# Patient Record
Sex: Male | Born: 1940 | Race: White | Hispanic: No | Marital: Single | Smoking: Former smoker
Health system: Southern US, Community
[De-identification: ages and names within clinical notes are randomized; demographics above are authoritative.]

## PROBLEM LIST (undated history)

## (undated) DIAGNOSIS — L309 Dermatitis, unspecified: Secondary | ICD-10-CM

## (undated) DIAGNOSIS — A809 Acute poliomyelitis, unspecified: Secondary | ICD-10-CM

## (undated) DIAGNOSIS — J449 Chronic obstructive pulmonary disease, unspecified: Secondary | ICD-10-CM

## (undated) HISTORY — DX: Chronic obstructive pulmonary disease, unspecified: J44.9

## (undated) HISTORY — DX: Dermatitis, unspecified: L30.9

## (undated) HISTORY — DX: Acute poliomyelitis, unspecified: A80.9

## (undated) HISTORY — PX: HERNIA REPAIR: SHX51

---

## 2003-03-10 ENCOUNTER — Encounter (HOSPITAL_BASED_OUTPATIENT_CLINIC_OR_DEPARTMENT_OTHER): Payer: Self-pay | Admitting: General Surgery

## 2003-03-16 ENCOUNTER — Ambulatory Visit (HOSPITAL_COMMUNITY): Admission: RE | Admit: 2003-03-16 | Discharge: 2003-03-16 | Payer: Self-pay | Admitting: General Surgery

## 2015-11-08 DIAGNOSIS — G252 Other specified forms of tremor: Secondary | ICD-10-CM | POA: Insufficient documentation

## 2015-11-08 DIAGNOSIS — J309 Allergic rhinitis, unspecified: Secondary | ICD-10-CM | POA: Insufficient documentation

## 2015-11-08 DIAGNOSIS — L309 Dermatitis, unspecified: Secondary | ICD-10-CM | POA: Insufficient documentation

## 2015-11-08 DIAGNOSIS — J449 Chronic obstructive pulmonary disease, unspecified: Secondary | ICD-10-CM | POA: Insufficient documentation

## 2015-11-08 DIAGNOSIS — R259 Unspecified abnormal involuntary movements: Secondary | ICD-10-CM

## 2015-11-09 DIAGNOSIS — N401 Enlarged prostate with lower urinary tract symptoms: Secondary | ICD-10-CM | POA: Insufficient documentation

## 2015-11-09 DIAGNOSIS — R600 Localized edema: Secondary | ICD-10-CM | POA: Insufficient documentation

## 2016-05-02 DIAGNOSIS — M1990 Unspecified osteoarthritis, unspecified site: Secondary | ICD-10-CM | POA: Insufficient documentation

## 2016-05-08 ENCOUNTER — Telehealth: Payer: Self-pay | Admitting: Hematology

## 2016-05-08 NOTE — Telephone Encounter (Signed)
Tc to pt who says that I should contact his sister-in-law or brother for his appts. Cld the pt's sister-in-law to schedule appt. Appt has been scheduled w/Kale on 12/13 at 11 am. Aware to have the pt arrive 30 minutes early. Unable to verify all of the pt's demographic info.

## 2016-05-16 ENCOUNTER — Ambulatory Visit (HOSPITAL_BASED_OUTPATIENT_CLINIC_OR_DEPARTMENT_OTHER): Payer: Medicare Other

## 2016-05-16 ENCOUNTER — Telehealth: Payer: Self-pay | Admitting: Hematology

## 2016-05-16 ENCOUNTER — Ambulatory Visit (HOSPITAL_BASED_OUTPATIENT_CLINIC_OR_DEPARTMENT_OTHER): Payer: Medicare Other | Admitting: Hematology

## 2016-05-16 ENCOUNTER — Encounter: Payer: Self-pay | Admitting: Hematology

## 2016-05-16 VITALS — BP 150/85 | HR 85 | Temp 97.5°F | Resp 18 | Ht 68.0 in | Wt 116.1 lb

## 2016-05-16 DIAGNOSIS — D721 Eosinophilia, unspecified: Secondary | ICD-10-CM

## 2016-05-16 DIAGNOSIS — R634 Abnormal weight loss: Secondary | ICD-10-CM

## 2016-05-16 LAB — CBC & DIFF AND RETIC
BASO%: 0.5 % (ref 0.0–2.0)
Basophils Absolute: 0 10*3/uL (ref 0.0–0.1)
EOS ABS: 0.6 10*3/uL — AB (ref 0.0–0.5)
EOS%: 7.9 % — AB (ref 0.0–7.0)
HCT: 41.9 % (ref 38.4–49.9)
HEMOGLOBIN: 14.7 g/dL (ref 13.0–17.1)
Immature Retic Fract: 0.7 % — ABNORMAL LOW (ref 3.00–10.60)
LYMPH#: 2.1 10*3/uL (ref 0.9–3.3)
LYMPH%: 26.2 % (ref 14.0–49.0)
MCH: 31.3 pg (ref 27.2–33.4)
MCHC: 35.1 g/dL (ref 32.0–36.0)
MCV: 89.3 fL (ref 79.3–98.0)
MONO#: 0.5 10*3/uL (ref 0.1–0.9)
MONO%: 5.7 % (ref 0.0–14.0)
NEUT#: 4.7 10*3/uL (ref 1.5–6.5)
NEUT%: 59.7 % (ref 39.0–75.0)
Platelets: 254 10*3/uL (ref 140–400)
RBC: 4.69 10*6/uL (ref 4.20–5.82)
RDW: 13.3 % (ref 11.0–14.6)
RETIC %: 0.76 % — AB (ref 0.80–1.80)
RETIC CT ABS: 35.64 10*3/uL (ref 34.80–93.90)
WBC: 7.9 10*3/uL (ref 4.0–10.3)

## 2016-05-16 LAB — TSH: TSH: 3.131 m(IU)/L (ref 0.320–4.118)

## 2016-05-16 LAB — COMPREHENSIVE METABOLIC PANEL
ALT: 17 U/L (ref 0–55)
AST: 19 U/L (ref 5–34)
Albumin: 3.9 g/dL (ref 3.5–5.0)
Alkaline Phosphatase: 82 U/L (ref 40–150)
Anion Gap: 9 mEq/L (ref 3–11)
BUN: 9.7 mg/dL (ref 7.0–26.0)
CHLORIDE: 107 meq/L (ref 98–109)
CO2: 27 mEq/L (ref 22–29)
CREATININE: 0.8 mg/dL (ref 0.7–1.3)
Calcium: 9.8 mg/dL (ref 8.4–10.4)
EGFR: 90 mL/min/{1.73_m2} — ABNORMAL LOW (ref >90)
GLUCOSE: 102 mg/dL (ref 70–140)
POTASSIUM: 4.8 meq/L (ref 3.5–5.1)
SODIUM: 143 meq/L (ref 136–145)
Total Bilirubin: 0.66 mg/dL (ref 0.20–1.20)
Total Protein: 7.5 g/dL (ref 6.4–8.3)

## 2016-05-16 LAB — LACTATE DEHYDROGENASE: LDH: 214 U/L (ref 125–245)

## 2016-05-16 LAB — CHCC SMEAR

## 2016-05-16 NOTE — Telephone Encounter (Signed)
Appointments scheduled per 12/13 LOS. Patient given AVS report and calendars with future scheduled appointments.  °

## 2016-05-16 NOTE — Progress Notes (Signed)
[facility]    HEMATOLOGY/ONCOLOGY CONSULTATION NOTE  Date of Service: 05/16/2016  Patient Care Team: Dewayne Shorter, PA-C as PCP - General (Physician Assistant)  CHIEF COMPLAINTS/PURPOSE OF CONSULTATION:  Eosinophilia  HISTORY OF PRESENTING ILLNESS:   Austin Perez is a wonderful 75 y.o. male who has been referred to [place] by Dr .Antony Salmon, Stephani Police, PA-C  for evaluation and management of eosinophilia.  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 Normal platelets of 251k.   patient notes no fevers no chills no night sweats. Does not report any enlarged lymph nodes. No unexpected weight loss. No new fatigue. No new pulmonary symptoms. No abdominal pain or distention.  Reports no new medications. Is not on chronic NSAIDs. No specific use of recent antibiotics.  Does have eczema that gets worse in the fall . Also has intermittent allergic rhinitis . No recent travel . No diarrhea . No focal symptoms suggestive of a malignancy.   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 #7 resting tremor  SURGICAL HISTORY:  Hernia repair  SOCIAL HISTORY: Social History   Social History  . Marital status: Single    Spouse name: N/A  . Number of children: N/A  . Years of education: N/A   Occupational History  . Not on file.   Social History Main Topics  . Smoking status: Not on file  . Smokeless tobacco: Not on file  . Alcohol use Not on file  . Drug use: Unknown  . Sexual activity: Not on file   Other Topics Concern  . Not on  file   Social History Narrative  . No narrative on file  Patient smoked one pack per day from age 61 to about age 10 years. 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 No family history of blood disorders  ALLERGIES:  has No Known Allergies.  MEDICATIONS:  Current Outpatient Prescriptions  Medication Sig Dispense Refill  . albuterol (PROVENTIL HFA;VENTOLIN HFA) 108 (90 Base) MCG/ACT inhaler INHALE 1 TO 2 PUFFS EVERY 4 TO 6 HOURS AS NEEDED.    [facility] Kitchen betamethasone dipropionate (DIPROLENE) 0.05 % cream APPLY SPARINGLY TO AFFECTED AREA(S) TWICE DAILY    . budesonide-formoterol (SYMBICORT) 160-4.5 MCG/ACT inhaler INHALE 1 PUFF TWICE DAILY    . doxazosin (CARDURA) 4 MG tablet TAKE 1-2 TABLETS BY MOUTH EVERY NIGHT ABOUT 30 MINUTES BEFORE BEDTIME     No current facility-administered medications for this visit.     REVIEW OF SYSTEMS:    10 Point review of Systems was done is negative except as noted above.  PHYSICAL EXAMINATION: ECOG PERFORMANCE STATUS: 2  . Vitals:   05/16/16 1113  BP: (!) 150/85  Pulse: 85  Resp: 18  Temp: 97.5 F (36.4 C)   Filed Weights   05/16/16 1113  Weight: 116 lb 1.6 oz (52.7 kg)   .Body mass index is 17.65 kg/m.  GENERAL:alert, in no acute distress and comfortable SKIN: No acute  rashes EYES: normal, conjunctiva are pink and non-injected, sclera clear OROPHARYNX:no exudate, no erythema and lips, buccal mucosa, and tongue normal  NECK: supple, no JVD, thyroid normal size, non-tender, without nodularity LYMPH:  no palpable lymphadenopathy in the cervical, axillary or inguinal LUNGS: Decreased air entry bilaterally no rales no rhonchi  HEART: regular rate & rhythm,  no murmurs ABDOMEN: abdomen soft, non-tender, normoactive bowel sounds , palpable hepatosplenomegaly Musculoskeletal: Bilateral trace pedal edema PSYCH: alert & oriented x 3 with fluent  speech NEURO: Some right-sided weakness  LABORATORY DATA:  I have reviewed the data as listed  . CBC Latest Ref Rng & Units 05/16/2016  WBC 4.0 - 10.3 10e3/uL 7.9  Hemoglobin 13.0 - 17.1 g/dL 14.7  Hematocrit 38.4 - 49.9 % 41.9  Platelets 140 - 400 10e3/uL 254   . CBC    Component Value Date/Time   WBC 7.9 05/16/2016 1223   RBC 4.69 05/16/2016 1223   HGB 14.7 05/16/2016 1223   HCT 41.9 05/16/2016 1223   PLT 254 05/16/2016 1223   MCV 89.3 05/16/2016 1223   MCH 31.3 05/16/2016 1223   MCHC 35.1 05/16/2016 1223   RDW 13.3 05/16/2016 1223   LYMPHSABS 2.1 05/16/2016 1223   MONOABS 0.5 05/16/2016 1223   EOSABS 0.6 (H) 05/16/2016 1223   BASOSABS 0.0 05/16/2016 1223   . CMP Latest Ref Rng & Units 05/16/2016  Glucose 70 - 140 mg/dl 102  BUN 7.0 - 26.0 mg/dL 9.7  Creatinine 0.7 - 1.3 mg/dL 0.8  Sodium 136 - 145 mEq/L 143  Potassium 3.5 - 5.1 mEq/L 4.8  CO2 22 - 29 mEq/L 27  Calcium 8.4 - 10.4 mg/dL 9.8  Total Protein 6.4 - 8.3 g/dL 7.5  Total Bilirubin 0.20 - 1.20 mg/dL 0.66  Alkaline Phos 40 - 150 U/L 82  AST 5 - 34 U/L 19  ALT 0 - 55 U/L 17   Component     Latest Ref Rng & Units 05/16/2016  Tryptase     2.2 - 13.2 ug/L 3.9  Sed Rate     0 - 30 mm/hr 2  TSH     0.320 - 4.118 m(IU)/L 3.131   . Lab Results  Component Value Date   LDH 214 05/16/2016      RADIOGRAPHIC STUDIES: I have personally reviewed the radiological images as listed and agreed with the findings in the report. No results found.  ASSESSMENT & PLAN:   75 year old gentleman with   1) Isolated Eosinophilia  His absolute eosinophil count has decreased from 2.7k to 0.6k from 05/03/2016 to 05/16/2016. This suggests a likely reactive in etiology. Patient had no fever no chills no night sweats no weight loss no lymphadenopathy or hepatosplenomegaly to suggest a lymphoproliferative or myeloproliferative process. No lymphocytosis. No polycythemia or thrombocytosis. LDH level is within normal  limits.  Workup was done to rule out a clonal eosinophilia that might suggest a hypereosinophilic syndrome. The PDGFRA, PDFGRB and FGFR1 mutations are negative which also reduces the chance that this is a clonal eosinophilia.  Plan -Workup with eosinophilia was done and does not suggest a clonal eosinophilia such as chronic eosinophilic leukemia or eosinophilia associated with a myeloproliferative or lymphoproliferative process. -Rapid improvement in eosinophilia suggested that it was likely a reactive process to an environmental allergy [known to have allergic rhinitis], or related to his eczema or some other medication. -No recent travel or gastrointestinal symptoms to suggest a parasitic infection. -No focal symptoms to suggest a paraneoplastic eosinophilia related to malignancy. Would  need to follow-up with his primary care physician for age-appropriate cancer screening. -Bone marrow biopsy is not recommended at this time.  Would recommend continued follow-up with his primary care physician with repeat CBC with differential in 6 months. Kindly consult [place] of his eosinophil counts continue to trend upwards to more than 3k.  I appreciate this interesting consultation.    All of the patients questions were answered with apparent satisfaction. The patient knows to call the clinic with any problems, questions or concerns.  I spent 45 minutes counseling the patient face to face. The total time spent in the appointment was 60 minutes and more than 50% was on counseling and direct patient cares.    Sullivan Lone MD [place] AAHIVMS [facility] Hematology/Oncology Physician [facility]  (Office):       [phone] (Work cell):  [phone] (Fax):           [phone]  05/16/2016 11:29 AM

## 2016-05-17 LAB — TRYPTASE: TRYPTASE: 3.9 ug/L (ref 2.2–13.2)

## 2016-05-17 LAB — SEDIMENTATION RATE: Sedimentation Rate-Westergren: 2 mm/hr (ref 0–30)

## 2016-05-23 LAB — MPN W/HYPEREOSINOPHILIA FISH
MPN CELLS ANALYZED: 100
MPN CELLS COUNTED: 100

## 2016-06-07 ENCOUNTER — Telehealth: Payer: Self-pay | Admitting: *Deleted

## 2016-06-07 NOTE — Telephone Encounter (Signed)
"  This is Austin AhmadiCarol Perez calling for results of last months blood work.  The nurse was to call results."    Will notify provider of this request.  Also no F/U appointments scheduled at this time.  Sister-n-law return number is [phone].

## 2016-07-04 ENCOUNTER — Telehealth: Payer: Self-pay | Admitting: *Deleted

## 2016-07-04 NOTE — Telephone Encounter (Signed)
"  This is Austin Perez calling for my husband Austin Perez.  We have been waiting for his lab results since December."  Please call [phone]."

## 2016-07-30 ENCOUNTER — Telehealth: Payer: Self-pay | Admitting: *Deleted

## 2016-07-30 DIAGNOSIS — D721 Eosinophilia, unspecified: Secondary | ICD-10-CM | POA: Insufficient documentation

## 2016-07-30 NOTE — Telephone Encounter (Signed)
-----   Message from Almon RegisterLoren M Oflaherty, RN sent at 07/30/2016  9:46 AM EST ----- Regarding: FW: results   ----- Message ----- From: Johney MaineGautam Kishore Kale, MD Sent: 07/29/2016  10:33 PM To: Almon RegisterLoren M Oflaherty, RN Subject: results                                        Hi Loren, Could you please call and let Mr Austin Perez know that his Eosinophil counts had near normalized and appear to be reactive likely from allergies. Hypereosinophilic syndrome FISH panel was negative and this strongly suggests against a clonal eosinophilic disorders such as chronic eosinophilic leukemia. Details per my note.  Thanks, GK

## 2016-07-30 NOTE — Telephone Encounter (Signed)
Per staff message, RN informed patient of information below and patient verbalized understanding.

## 2017-01-02 ENCOUNTER — Other Ambulatory Visit: Payer: Self-pay

## 2017-01-02 DIAGNOSIS — D721 Eosinophilia, unspecified: Secondary | ICD-10-CM

## 2017-01-03 ENCOUNTER — Other Ambulatory Visit: Payer: Medicare Other

## 2017-01-03 ENCOUNTER — Ambulatory Visit: Payer: Medicare Other | Admitting: Hematology

## 2017-09-03 DIAGNOSIS — A809 Acute poliomyelitis, unspecified: Secondary | ICD-10-CM | POA: Insufficient documentation

## 2020-08-18 ENCOUNTER — Ambulatory Visit: Payer: Medicare (Managed Care) | Admitting: Podiatry

## 2020-08-30 ENCOUNTER — Ambulatory Visit (INDEPENDENT_AMBULATORY_CARE_PROVIDER_SITE_OTHER): Payer: Medicare (Managed Care) | Admitting: Podiatry

## 2020-08-30 ENCOUNTER — Encounter: Payer: Self-pay | Admitting: Podiatry

## 2020-08-30 ENCOUNTER — Other Ambulatory Visit: Payer: Self-pay

## 2020-08-30 DIAGNOSIS — B351 Tinea unguium: Secondary | ICD-10-CM | POA: Diagnosis not present

## 2020-08-30 DIAGNOSIS — M79676 Pain in unspecified toe(s): Secondary | ICD-10-CM

## 2020-08-30 NOTE — Progress Notes (Signed)
  Subjective:  Patient ID: Austin Perez, male    DOB: [birth year],  MRN: [id number] HPI Chief Complaint  Patient presents with  . Toe Pain    3rd toe right - tip of toe sore, nail is long and thick  . Toe Injury    4th toe right - toenail fell off or got caught onto something - unsure  . Debridement    Toenails - long, thick and discolored - unable to trim well himself  . New Patient (Initial Visit)    80 y.o. male presents with the above complaint.   ROS: Denies fever chills nausea vomiting muscle aches pains calf pain back pain chest pain shortness of breath.  Past Medical History:  Diagnosis Date  . COPD (chronic obstructive pulmonary disease) (HCC)   . Eczema   . Polio      Current Outpatient Medications:  .  doxazosin (CARDURA) 4 MG tablet, Take 4 mg by mouth daily., Disp: , Rfl:  .  albuterol (PROVENTIL HFA;VENTOLIN HFA) 108 (90 Base) MCG/ACT inhaler, INHALE 1 TO 2 PUFFS EVERY 4 TO 6 HOURS AS NEEDED., Disp: , Rfl:  .  budesonide-formoterol (SYMBICORT) 160-4.5 MCG/ACT inhaler, INHALE 1 PUFF TWICE DAILY, Disp: , Rfl:   No Known Allergies Review of Systems Objective:  There were no vitals filed for this visit.  General: Well developed, nourished, in no acute distress, alert and oriented x3   Dermatological: Skin is warm, dry and supple bilateral. Nails x 10 are well maintained; remaining integument appears unremarkable at this time. There are no open sores, no preulcerative lesions, no rash or signs of infection present.  Distal clavus third toe right foot nail avulsion fourth toe right foot   Vascular: Dorsalis Pedis artery and Posterior Tibial artery pedal pulses are 2/4 bilateral with immedate capillary fill time. Pedal hair growth present. No varicosities and no lower extremity edema present bilateral.   Neruologic: Grossly intact via light touch bilateral. Vibratory intact via tuning fork bilateral. Protective threshold with Semmes Wienstein monofilament intact to  all pedal sites bilateral. Patellar and Achilles deep tendon reflexes 2+ bilateral. No Babinski or clonus noted bilateral.   Musculoskeletal: No gross boney pedal deformities bilateral. No pain, crepitus, or limitation noted with foot and ankle range of motion bilateral. Muscular strength 5/5 in all groups tested bilateral.  Gait: Unassisted, Nonantalgic.    Radiographs:  None taken  Assessment & Plan:   Assessment: Hammertoe deformities with calluses distal clavus third digit right.  Pain in limb secondary to onychomycosis bilateral.  Plan: Debridement of nails and calluses today placed buttress pad underneath the right forefoot.  He will follow-up with Dr. Donzetta Matters for routine care.     Makisha Marrin T. [place], [place]

## 2020-11-02 ENCOUNTER — Other Ambulatory Visit: Payer: Self-pay | Admitting: Family Medicine

## 2020-11-02 ENCOUNTER — Ambulatory Visit
Admission: RE | Admit: 2020-11-02 | Discharge: 2020-11-02 | Disposition: A | Discharge status: Home / Self Care | Payer: Medicare Other | Source: Ambulatory Visit | Attending: Family Medicine | Admitting: Family Medicine

## 2020-11-02 DIAGNOSIS — R634 Abnormal weight loss: Secondary | ICD-10-CM

## 2020-11-02 DIAGNOSIS — J449 Chronic obstructive pulmonary disease, unspecified: Secondary | ICD-10-CM

## 2020-12-12 ENCOUNTER — Other Ambulatory Visit: Payer: Self-pay

## 2020-12-12 ENCOUNTER — Ambulatory Visit (INDEPENDENT_AMBULATORY_CARE_PROVIDER_SITE_OTHER): Payer: Medicare (Managed Care) | Admitting: Podiatry

## 2020-12-12 DIAGNOSIS — M79676 Pain in unspecified toe(s): Secondary | ICD-10-CM | POA: Diagnosis not present

## 2020-12-12 DIAGNOSIS — L84 Corns and callosities: Secondary | ICD-10-CM

## 2020-12-12 DIAGNOSIS — M79674 Pain in right toe(s): Secondary | ICD-10-CM

## 2020-12-12 DIAGNOSIS — B351 Tinea unguium: Secondary | ICD-10-CM | POA: Diagnosis not present

## 2020-12-12 NOTE — Progress Notes (Signed)
Subjective: Austin Perez is a pleasant 80 y.o. male patient seen today painful thick toenails that are difficult to trim. Pain interferes with ambulation. Aggravating factors include wearing enclosed shoe gear. Pain is relieved with periodic professional debridement.  His son is present during today's visit. They voice no new pedal problems on today's visit.  PCP is Gwenlyn Found, MD. Last visit was: 10/26/2020.  No Known Allergies  Objective: Physical Exam  General: Austin Perez is a pleasant 80 y.o. Caucasian male, WD, WN in NAD. AAO x 3.   Vascular:  Capillary refill time to digits immediate b/l. Palpable pedal pulses b/l LE. Pedal hair absent. Lower extremity skin temperature gradient within normal limits. No pain with calf compression b/l. No edema noted b/l lower extremities.  Dermatological:  Pedal skin with normal turgor, texture and tone b/l lower extremities No open wounds b/l lower extremities No interdigital macerations b/l lower extremities Toenails 1-5 b/l elongated, discolored, dystrophic, thickened, crumbly with subungual debris and tenderness to dorsal palpation. Hyperkeratotic lesion(s) R 3rd toe.  No erythema, no edema, no drainage, no fluctuance.  Musculoskeletal:  Normal muscle strength 5/5 to all lower extremity muscle groups bilaterally. No pain crepitus or joint limitation noted with ROM b/l. Hammertoe(s) noted to the 2-5 bilaterally.  Neurological:  Protective sensation intact 5/5 intact bilaterally with 10g monofilament b/l.  Assessment and Plan:  1. Pain due to onychomycosis of toenail   2. Clavus   3. Pain in right toe(s)     -Examined patient. -Patient to continue soft, supportive shoe gear daily. -Toenails 1-5 b/l were debrided in length and girth with sterile nail nippers and dremel without iatrogenic bleeding.  -Corn(s) R 3rd toe pared utilizing sterile scalpel blade without complication or incident. Total number debrided=1. Continue  buttress pad for daily protection. -Patient to report any pedal injuries to medical professional immediately. -Patient/POA to call should there be question/concern in the interim.  Return in about 3 months (around 03/14/2021).  Freddie Breech, DPM

## 2020-12-15 ENCOUNTER — Encounter: Payer: Self-pay | Admitting: Podiatry

## 2021-03-20 ENCOUNTER — Ambulatory Visit (INDEPENDENT_AMBULATORY_CARE_PROVIDER_SITE_OTHER): Payer: Medicare (Managed Care) | Admitting: Podiatry

## 2021-03-20 ENCOUNTER — Other Ambulatory Visit: Payer: Self-pay

## 2021-03-20 DIAGNOSIS — B351 Tinea unguium: Secondary | ICD-10-CM

## 2021-03-20 DIAGNOSIS — M79676 Pain in unspecified toe(s): Secondary | ICD-10-CM

## 2021-03-23 ENCOUNTER — Encounter: Payer: Self-pay | Admitting: Podiatry

## 2021-03-23 NOTE — Progress Notes (Signed)
  Subjective:  Patient ID: Austin Perez, male    DOB: 05-29-1941,  MRN: [id number]  Austin Perez presents to clinic today for thick, elongated toenails b/l feet which are tender when wearing enclosed shoe gear.  He notes no new pedal problems on today's visit.  PCP is Gwenlyn Found, MD , and last visit was 01/27/2021.  No Known Allergies  Review of Systems: Negative except as noted in the HPI. Objective:   Constitutional Austin Perez is a pleasant 80 y.o. Caucasian male, WD, WN in NAD. AAO x 3.   Vascular Capillary refill time to digits immediate b/l. Palpable DP pulse(s) b/l lower extremities Palpable PT pulse(s) b/l lower extremities Pedal hair absent. Lower extremity skin temperature gradient within normal limits. No pain with calf compression b/l. No edema noted b/l lower extremities. No cyanosis or clubbing noted.  Neurologic Normal speech. Oriented to person, place, and time. Protective sensation intact 5/5 intact bilaterally with 10g monofilament b/l. Vibratory sensation intact b/l.  Dermatologic Pedal integument with normal turgor, texture and tone BLE. No open wounds b/l LE. No interdigital macerations b/l lower extremities. Toenails 1-5 left, R hallux, R 2nd toe, R 3rd toe, and R 5th toe elongated, discolored, dystrophic, thickened, and crumbly with subungual debris and tenderness to dorsal palpation. There is noted onchyolysis of entire nailplate of R 4th toe.  The nailbeds remain intact. There is no erythema, no edema, no drainage, no underlying fluctuance.  Orthopedic: Normal muscle strength 5/5 to all lower extremity muscle groups bilaterally. Hammertoe(s) noted to the 2-5 bilaterally.   Radiographs: None Assessment:   1. Pain due to onychomycosis of toenail    Plan:  Patient was evaluated and treated and all questions answered. Consent given for treatment as described below: -Patient to continue soft, supportive shoe gear daily. -Toenails 1-5 left, R hallux, R  2nd toe, R 3rd toe, and R 5th toe debrided in length and girth without iatrogenic bleeding with sterile nail nipper and dremel.  -Right 4th toe nailplate gently debrided from it's remaining attachment to digit. Nailbed cleansed with alcohol. Triple antibiotic ointment applied. -Patient to report any pedal injuries to medical professional immediately. -Patient/POA to call should there be question/concern in the interim.  Return in about 3 months (around 06/20/2021).  Freddie Breech, DPM

## 2021-05-17 DIAGNOSIS — I700 Atherosclerosis of aorta: Secondary | ICD-10-CM | POA: Insufficient documentation

## 2021-06-28 ENCOUNTER — Ambulatory Visit (INDEPENDENT_AMBULATORY_CARE_PROVIDER_SITE_OTHER): Payer: Medicare (Managed Care) | Admitting: Podiatry

## 2021-06-28 ENCOUNTER — Encounter: Payer: Self-pay | Admitting: Podiatry

## 2021-06-28 ENCOUNTER — Other Ambulatory Visit: Payer: Self-pay

## 2021-06-28 DIAGNOSIS — B351 Tinea unguium: Secondary | ICD-10-CM | POA: Diagnosis not present

## 2021-06-28 DIAGNOSIS — M79676 Pain in unspecified toe(s): Secondary | ICD-10-CM

## 2021-07-04 ENCOUNTER — Encounter: Payer: Self-pay | Admitting: Podiatry

## 2021-07-04 NOTE — Progress Notes (Signed)
°  Subjective:  Patient ID: Austin Perez, male    DOB: 08/15/1940,  MRN: [id number]  81 y.o. male presents painful elongated mycotic toenails 1-5 bilaterally which are tender when wearing enclosed shoe gear. Pain is relieved with periodic professional debridement.  New problem(s): None   PCP is Gwenlyn Found, MD , and last visit was 12//14/2022.  No Known Allergies  Review of Systems: Negative except as noted in the HPI.   Objective:  Vascular Examination: Vascular status intact b/l with palpable pedal pulses. CFT immediate b/l. No edema. No pain with calf compression b/l. Skin temperature gradient WNL b/l. Pedal hair absent.  Neurological Examination: Protective sensation intact 5/5 intact bilaterally with 10g monofilament b/l. Vibratory sensation intact b/l.  Dermatological Examination: Pedal skin warm and supple b/l.  No open wounds b/l. No interdigital macerations. Toenails 1-5 b/l elongated, thickened, discolored with subungual debris. +Tenderness with dorsal palpation of nailplates. No hyperkeratotic nor porokeratotic lesions noted b/l.  Musculoskeletal Examination: Muscle strength 5/5 to all lower extremity muscle groups bilaterally. No pain, crepitus or joint limitation noted with ROM bilateral LE. Hammertoe deformity noted 2-5 b/l.  Radiographs: None  Assessment:   1. Pain due to onychomycosis of toenail    Plan:  -No new findings. No new orders. -Mycotic toenails 1-5 bilaterally were debrided in length and girth with sterile nail nippers and dremel without incident. -Patient/POA to call should there be question/concern in the interim.  Return in about 3 months (around 09/26/2021).  Freddie Breech, DPM

## 2021-09-29 ENCOUNTER — Encounter: Payer: Self-pay | Admitting: Podiatry

## 2021-09-29 ENCOUNTER — Ambulatory Visit (INDEPENDENT_AMBULATORY_CARE_PROVIDER_SITE_OTHER): Payer: Medicare (Managed Care) | Admitting: Podiatry

## 2021-09-29 DIAGNOSIS — M79676 Pain in unspecified toe(s): Secondary | ICD-10-CM

## 2021-09-29 DIAGNOSIS — B351 Tinea unguium: Secondary | ICD-10-CM

## 2021-10-08 NOTE — Progress Notes (Signed)
?  Subjective:  ?Patient ID: Austin Perez, male    DOB: [birth year],  MRN: [id number] ? ?Austin Perez presents to clinic today for painful thick toenails that are difficult to trim. Pain interferes with ambulation. Aggravating factors include wearing enclosed shoe gear. Pain is relieved with periodic professional debridement. ? ?New problem(s): None.  ? ?PCP is Gwenlyn Found, MD , and last visit was May 17, 2021. ? ?No Known Allergies ? ?Review of Systems: Negative except as noted in the HPI. ? ?Objective: No changes noted in today's physical examination. ? ?Vascular Examination: ?Palpable pedal pulses b/l LE. Digital hair present b/l. No pedal edema b/l. Skin temperature gradient WNL b/l. No varicosities b/l. ? ?Dermatological Examination: ?Pedal skin with normal turgor, texture and tone b/l. No open wounds. No interdigital macerations b/l. Toenails 1-5 b/l thickened, discolored, dystrophic with subungual debris. There is pain on palpation to dorsal aspect of nailplates. No hyperkeratotic nor porokeratotic lesions present on today's visit.. ? ?Neurological Examination: ?Protective sensation intact with 10 gram monofilament b/l LE. Vibratory sensation intact b/l LE.  ? ?Musculoskeletal Examination: ?Muscle strength 5/5 to all LE muscle groups b/l. No pain, crepitus or joint limitation noted with ROM bilateral LE. Hammertoe deformity noted 2-5 b/l. ? ?Assessment/Plan: ?1. Pain due to onychomycosis of toenail   ?  ?-Patient was evaluated and treated. All patient's and/or POA's questions/concerns answered on today's visit. ?-Patient to continue soft, supportive shoe gear daily. ?-Toenails 1-5 b/l were debrided in length and girth with sterile nail nippers and dremel without iatrogenic bleeding.  ?-Patient/POA to call should there be question/concern in the interim.  ? ?Return in about 3 months (around 12/29/2021). ? ?Freddie Breech, DPM  ?

## 2021-12-12 ENCOUNTER — Ambulatory Visit: Payer: Medicare (Managed Care) | Attending: Family Medicine

## 2021-12-12 DIAGNOSIS — M79604 Pain in right leg: Secondary | ICD-10-CM

## 2021-12-12 DIAGNOSIS — R262 Difficulty in walking, not elsewhere classified: Secondary | ICD-10-CM | POA: Insufficient documentation

## 2021-12-12 DIAGNOSIS — M79605 Pain in left leg: Secondary | ICD-10-CM | POA: Insufficient documentation

## 2021-12-12 DIAGNOSIS — M6281 Muscle weakness (generalized): Secondary | ICD-10-CM | POA: Diagnosis not present

## 2021-12-12 DIAGNOSIS — R252 Cramp and spasm: Secondary | ICD-10-CM

## 2021-12-12 NOTE — Therapy (Signed)
OUTPATIENT PHYSICAL THERAPY LOWER EXTREMITY EVALUATION   Patient Name: Austin Perez MRN: [id number] DOB:03-11-1941, 81 y.o., male Today's Date: 12/12/2021   PT End of Session - 12/12/21 1106     Visit Number 1    Date for PT Re-Evaluation 02/06/22    Authorization Type [employer] and [employer]    PT Start Time 1100    PT Stop Time 1145    PT Time Calculation (min) 45 min    Activity Tolerance Patient tolerated treatment well    Behavior During Therapy WFL for tasks assessed/performed             Past Medical History:  Diagnosis Date   COPD (chronic obstructive pulmonary disease) (HCC)    Eczema    Polio    Past Surgical History:  Procedure Laterality Date   HERNIA REPAIR     Patient Active Problem List   Diagnosis Date Noted   Aortic atherosclerosis (HCC) 05/17/2021   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   Allergic eosinophilia 07/30/2016   Arthritis 05/02/2016   Benign prostatic hyperplasia with lower urinary tract symptoms 11/09/2015   Edema of both legs 11/09/2015   Allergic rhinitis 11/08/2015   COPD (chronic obstructive pulmonary disease) (HCC) 11/08/2015   Eczema 11/08/2015   Resting tremor 11/08/2015    PCP: Gwenlyn Found, MD  REFERRING PROVIDER: Gwenlyn Found, MD  REFERRING DIAG: [phone] (ICD-10-CM) - Pain in left leg   THERAPY DIAG:  Pain in right leg - Plan: PT plan of care cert/re-cert  Difficulty in walking, not elsewhere classified - Plan: PT plan of care cert/re-cert  Cramp and spasm - Plan: PT plan of care cert/re-cert  Muscle weakness (generalized) - Plan: PT plan of care cert/re-cert  Rationale for Evaluation and Treatment Rehabilitation  ONSET DATE: 11/24/21  SUBJECTIVE:   SUBJECTIVE STATEMENT: Patient states he has experienced left leg pain off and on for about one year.  He explains that the pain has worsened in the last 2 months. He is a post polio patient.  He lives alone and is able to do all ADL's and IADL's with exception of  heavy outdoor activities and driving.  He hopes to gain strength and be able to walk with less pain.    PERTINENT HISTORY: Notes from PCP Timofey Carandang is a 81 y.o. male with a PMH of polio with residual weakness, BPH, and emphysema who presents for left leg pain. Left leg pain, chronic, waxes and wanes, usually responds to occasional ibuprofen and usually does not need to take this daily. However, the past 3 weeks it has been worse and has been daily and constant. Needs to take ibuprofen 400mg  BID pretty regularly now, which helps until it wears off. Pain is in anterior mid portion of upper left leg and the muscle and radiates distally sometimes to the knee or lower leg and radiates to lateral upper portion of left hip and low back sometimes. Worse with bearing weight because he puts most of his weight on his left side of his body due to his weakness from polio. Relieved with sitting. Not worse with laying. Denies any associated focal or nearby numbness and tingling.  PAIN:  Are you having pain? Yes: NPRS scale: 9/10 Pain location: Left leg from hip down to thigh and lower leg Pain description: aching Aggravating factors: standing and walking Relieving factors: lying down/sitting down  PRECAUTIONS: Other: post polio  WEIGHT BEARING RESTRICTIONS No  FALLS:  Has patient fallen in last 6 months? No  LIVING ENVIRONMENT:  Lives with: lives alone Lives in: House/apartment Stairs: No Has following equipment at home: Environmental consultant - 4 wheeled  OCCUPATION: retired  PLOF: Independent and does not drive  PATIENT GOALS to be able to walk better and not hurt   OBJECTIVE:   DIAGNOSTIC FINDINGS: none  PATIENT SURVEYS:  FOTO 47 (goal is 54)  COGNITION:  Overall cognitive status: Within functional limits for tasks assessed     SENSATION: WFL   MUSCLE LENGTH: Hamstrings: Right 30 deg; Left 35 deg Thomas test: positive bilaterally  POSTURE:  severe scoliosis and LE deformities post  polio   LOWER EXTREMITY ROM:  Severe contractures bilaterally.  Patient has functional ROM throughout.   LOWER EXTREMITY MMT:  Generally 2+ to 3+ /5 bilaterally   FUNCTIONAL TESTS:  5 times sit to stand: test next visit Timed up and go (TUG): test next visit  GAIT: Distance walked: 50 Assistive device utilized: Environmental consultant - 4 wheeled Level of assistance: Modified independence Comments: severe contracturesss    TODAY'S TREATMENT: Initiated HEP and initial eval completed   PATIENT EDUCATION:  Education details: Initiated HEP Person educated: Patient Education method: Programmer, multimedia, Facilities manager, Verbal cues, and Handouts Education comprehension: verbalized understanding, returned demonstration, and tactile cues required   HOME EXERCISE PROGRAM: Access Code: [email or web address] URL: [email or web address] Date: 12/12/2021 Prepared by: Mikey Kirschner  Exercises - Supine Hip Adductor Stretch  - 1 x daily - 7 x weekly - 1 sets - 10 reps - 10 sec hold - Supine Single Knee to Chest Stretch  - 1 x daily - 7 x weekly - 1 sets - 10 reps - 5 sec hold - Supine Lower Trunk Rotation  - 1 x daily - 7 x weekly - 1 sets - 20 reps - Hooklying Clamshell with Resistance  - 1 x daily - 7 x weekly - 3 sets - 10 reps  ASSESSMENT:  CLINICAL IMPRESSION: Patient is a 81 y.o. male who was seen today for physical therapy evaluation and treatment for right LE pain. He is post polio patient who presents with   OBJECTIVE IMPAIRMENTS Abnormal gait, decreased balance, decreased endurance, difficulty walking, decreased strength, increased muscle spasms, impaired flexibility, postural dysfunction, and pain.   ACTIVITY LIMITATIONS carrying, lifting, bending, sitting, standing, squatting, sleeping, stairs, transfers, and dressing  PARTICIPATION LIMITATIONS: meal prep, cleaning, laundry, driving, shopping, community activity, and yard work  PERSONAL FACTORS Fitness and 1-2 comorbidities: HTN, post  polio syndrome  are also affecting patient's functional outcome.   REHAB POTENTIAL: Good  CLINICAL DECISION MAKING: Stable/uncomplicated  EVALUATION COMPLEXITY: Low   GOALS: Goals reviewed with patient? Yes  SHORT TERM GOALS: Target date: 01/09/2022  Patient will be independent with initial HEP  Baseline: Goal status: INITIAL  2.  Pain report to be no greater than 4/10  Baseline:  Goal status: INITIAL  3.  Patient to be able to ambulate 100 feet with pain no greater than 4/10 Baseline:  Goal status: INITIAL   LONG TERM GOALS: Target date: 02/06/2022   Patient to be independent with advanced HEP  Baseline:  Goal status: INITIAL  2.  Patient to report pain no greater than 2/10  Baseline:  Goal status: INITIAL  3.  FOTO to be  Baseline:  Goal status: INITIAL  4.  Patient to report 85% improvement in overall function Baseline:  Goal status: INITIAL  5.  Patient to improve 5 times sit to stand and TUG by 2 sec Baseline:  Goal status: INITIAL    PLAN: PT FREQUENCY: 1-2x/week  PT DURATION: 8 weeks  PLANNED INTERVENTIONS: Therapeutic exercises, Therapeutic activity, Neuromuscular re-education, Balance training, Gait training, Patient/Family education, Joint mobilization, Stair training, DME instructions, Aquatic Therapy, Dry Needling, Electrical stimulation, Cryotherapy, Moist heat, Taping, Ultrasound, Ionotophoresis 4mg /ml Dexamethasone, Manual therapy, and Re-evaluation  PLAN FOR NEXT SESSION: complete TUG and 5 times sit to stand,  Review HEP, NuStep, right LE strengthening and stability, left and right LE flexibility exercises   Kharter Sestak B. Zeric Baranowski, PT 12/12/21 10:23 PM  [facility] [address], [address] [place], [place] Phone # [phone] Fax [phone]

## 2021-12-20 ENCOUNTER — Ambulatory Visit: Payer: Medicare (Managed Care)

## 2021-12-20 DIAGNOSIS — R262 Difficulty in walking, not elsewhere classified: Secondary | ICD-10-CM

## 2021-12-20 DIAGNOSIS — R252 Cramp and spasm: Secondary | ICD-10-CM

## 2021-12-20 DIAGNOSIS — M6281 Muscle weakness (generalized): Secondary | ICD-10-CM

## 2021-12-20 DIAGNOSIS — M79604 Pain in right leg: Secondary | ICD-10-CM

## 2021-12-20 DIAGNOSIS — M79605 Pain in left leg: Secondary | ICD-10-CM | POA: Diagnosis not present

## 2021-12-20 NOTE — Therapy (Signed)
OUTPATIENT PHYSICAL THERAPY TREATMENT NOTE   Patient Name: Austin Perez MRN: [id number] DOB:[birth year], 81 y.o., male Today's Date: 12/20/2021  PCP:   Gwenlyn Found, MD   REFERRING PROVIDER:   Gwenlyn Found, MD    END OF SESSION:   PT End of Session - 12/20/21 1246     Visit Number 2    Date for PT Re-Evaluation 02/06/22    Authorization Type [employer] and [employer]    PT Start Time 1230    PT Stop Time 1310    PT Time Calculation (min) 40 min    Activity Tolerance Patient tolerated treatment well    Behavior During Therapy WFL for tasks assessed/performed             Past Medical History:  Diagnosis Date   COPD (chronic obstructive pulmonary disease) (HCC)    Eczema    Polio    Past Surgical History:  Procedure Laterality Date   HERNIA REPAIR     Patient Active Problem List   Diagnosis Date Noted   Aortic atherosclerosis (HCC) 05/17/2021   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   Allergic eosinophilia 07/30/2016   Arthritis 05/02/2016   Benign prostatic hyperplasia with lower urinary tract symptoms 11/09/2015   Edema of both legs 11/09/2015   Allergic rhinitis 11/08/2015   COPD (chronic obstructive pulmonary disease) (HCC) 11/08/2015   Eczema 11/08/2015   Resting tremor 11/08/2015    REFERRING DIAG: M79.605 (ICD-10-CM) - Pain in left leg   THERAPY DIAG:  Pain in right leg  Difficulty in walking, not elsewhere classified  Cramp and spasm  Muscle weakness (generalized)  Rationale for Evaluation and Treatment Rehabilitation  PERTINENT HISTORY: post polio  PRECAUTIONS: post polio  SUBJECTIVE: Patient states he was a little sore from last session but this resolved within a day or two.  Pain persists in right leg but some improvement.    PAIN:  Are you having pain? Yes: NPRS scale: 4/10 Pain location: right leg Pain description: aching Aggravating factors: walking Relieving factors: rest   OBJECTIVE: (objective measures completed at initial  evaluation unless otherwise dated)   OBJECTIVE:    DIAGNOSTIC FINDINGS: none   PATIENT SURVEYS:  FOTO 21 (goal is 34)   COGNITION:           Overall cognitive status: Within functional limits for tasks assessed                          SENSATION: WFL     MUSCLE LENGTH: Hamstrings: Right 30 deg; Left 35 deg Thomas test: positive bilaterally   POSTURE:  severe scoliosis and LE deformities post polio     LOWER EXTREMITY ROM:   Severe contractures bilaterally.  Patient has functional ROM throughout.    LOWER EXTREMITY MMT:   Generally 2+ to 3+ /5 bilaterally     FUNCTIONAL TESTS:  5 times sit to stand: test next visit Timed up and go (TUG): test next visit   GAIT: Distance walked: 50 Assistive device utilized: Environmental consultant - 4 wheeled Level of assistance: Modified independence Comments: severe contracturesss       TODAY'S TREATMENT: 12-20-21 Nustep x 10 min level 1, seat 8, arms10 (314 steps) Seated hamstring stretch 2 x 30 sec each LE Seated hip adductor stretch x5 hold 10 sec each Seated clam x 20 Side sitting hip flexor stretch 2 x30 sec each LE LAQ and Marching in place x 20 with 2.5 lb  TODAY'S TREATMENT: 12-12-21 Initiated HEP  and initial eval completed     PATIENT EDUCATION:  Education details: Initiated HEP Person educated: Patient Education method: Programmer, multimedia, Facilities manager, Verbal cues, and Handouts Education comprehension: verbalized understanding, returned demonstration, and tactile cues required     HOME EXERCISE PROGRAM: Access Code: [email or web address] URL: [email or web address] Date: 12/12/2021 Prepared by: Mikey Kirschner   Exercises - Supine Hip Adductor Stretch  - 1 x daily - 7 x weekly - 1 sets - 10 reps - 10 sec hold - Supine Single Knee to Chest Stretch  - 1 x daily - 7 x weekly - 1 sets - 10 reps - 5 sec hold - Supine Lower Trunk Rotation  - 1 x daily - 7 x weekly - 1 sets - 20 reps - Hooklying Clamshell with Resistance  - 1 x  daily - 7 x weekly - 3 sets - 10 reps   ASSESSMENT:   CLINICAL IMPRESSION: Patient was able to tolerate Nustep and all stretches today without increased pain.  He initially felt he would need rest break while on Nustep but completed all 10 min without rest.  He should continue to benefit from ROM and flexibility exercises.       OBJECTIVE IMPAIRMENTS Abnormal gait, decreased balance, decreased endurance, difficulty walking, decreased strength, increased muscle spasms, impaired flexibility, postural dysfunction, and pain.    ACTIVITY LIMITATIONS carrying, lifting, bending, sitting, standing, squatting, sleeping, stairs, transfers, and dressing   PARTICIPATION LIMITATIONS: meal prep, cleaning, laundry, driving, shopping, community activity, and yard work   PERSONAL FACTORS Fitness and 1-2 comorbidities: HTN, post polio syndrome  are also affecting patient's functional outcome.    REHAB POTENTIAL: Good   CLINICAL DECISION MAKING: Stable/uncomplicated   EVALUATION COMPLEXITY: Low     GOALS: Goals reviewed with patient? Yes   SHORT TERM GOALS: Target date: 01/09/2022  Patient will be independent with initial HEP  Baseline: Goal status: INITIAL   2.  Pain report to be no greater than 4/10  Baseline:  Goal status: INITIAL   3.  Patient to be able to ambulate 100 feet with pain no greater than 4/10 Baseline:  Goal status: INITIAL     LONG TERM GOALS: Target date: 02/06/2022    Patient to be independent with advanced HEP  Baseline:  Goal status: INITIAL   2.  Patient to report pain no greater than 2/10  Baseline:  Goal status: INITIAL   3.  FOTO to be  Baseline:  Goal status: INITIAL   4.  Patient to report 85% improvement in overall function Baseline:  Goal status: INITIAL   5.  Patient to improve 5 times sit to stand and TUG by 2 sec Baseline:  Goal status: INITIAL       PLAN: PT FREQUENCY: 1-2x/week   PT DURATION: 8 weeks   PLANNED INTERVENTIONS: Therapeutic  exercises, Therapeutic activity, Neuromuscular re-education, Balance training, Gait training, Patient/Family education, Joint mobilization, Stair training, DME instructions, Aquatic Therapy, Dry Needling, Electrical stimulation, Cryotherapy, Moist heat, Taping, Ultrasound, Ionotophoresis 4mg /ml Dexamethasone, Manual therapy, and Re-evaluation   PLAN FOR NEXT SESSION: complete TUG and 5 times sit to stand,  Review HEP, NuStep, right LE strengthening and stability, left and right LE flexibility exercises  Alyssa Mancera B. Kenidy Crossland, PT 12/20/21 11:10 PM  [facility] [address], [address] [place], [place] Phone # [phone] Fax [phone]

## 2021-12-28 NOTE — Therapy (Signed)
OUTPATIENT PHYSICAL THERAPY TREATMENT NOTE   Patient Name: Austin Perez MRN: [id number] DOB:05-02-1941, 81 y.o., male Today's Date: 12/29/2021  PCP:   Nickola Major, MD   REFERRING PROVIDER:   Nickola Major, MD    END OF SESSION:   PT End of Session - 12/29/21 1104     Visit Number 3    Date for PT Re-Evaluation 02/06/22    Authorization Type [employer] and [employer]    PT Start Time 1104    PT Stop Time 1136    PT Time Calculation (min) 32 min    Activity Tolerance Patient tolerated treatment well;Patient limited by fatigue    Behavior During Therapy [facility] for tasks assessed/performed              Past Medical History:  Diagnosis Date   COPD (chronic obstructive pulmonary disease) ([place])    Eczema    Polio    Past Surgical History:  Procedure Laterality Date   HERNIA REPAIR     Patient Active Problem List   Diagnosis Date Noted   Aortic atherosclerosis ([place]) 05/17/2021   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   Allergic eosinophilia 07/30/2016   Arthritis 05/02/2016   Benign prostatic hyperplasia with lower urinary tract symptoms 11/09/2015   Edema of both legs 11/09/2015   Allergic rhinitis 11/08/2015   COPD (chronic obstructive pulmonary disease) ([place]) 11/08/2015   Eczema 11/08/2015   Resting tremor 11/08/2015    REFERRING DIAG: M79.605 (ICD-10-CM) - Pain in left leg   THERAPY DIAG:  Pain in right leg  Difficulty in walking, not elsewhere classified  Cramp and spasm  Muscle weakness (generalized)  Rationale for Evaluation and Treatment Rehabilitation  PERTINENT HISTORY: post polio  PRECAUTIONS: post polio  SUBJECTIVE: I am doing my exercises at home.   PAIN:  Are you having pain? Not right now, I took some medication and that helped.    OBJECTIVE: (objective measures completed at initial evaluation unless otherwise dated)   OBJECTIVE:    DIAGNOSTIC FINDINGS: none   PATIENT SURVEYS:  FOTO 47 (goal is 12)   COGNITION:           Overall  cognitive status: Within functional limits for tasks assessed                          SENSATION: WFL     MUSCLE LENGTH: Hamstrings: Right 30 deg; Left 35 deg Thomas test: positive bilaterally   POSTURE:  severe scoliosis and LE deformities post polio     LOWER EXTREMITY ROM:   Severe contractures bilaterally.  Patient has functional ROM throughout.    LOWER EXTREMITY MMT:   Generally 2+ to 3+ /5 bilaterally     FUNCTIONAL TESTS:  12/29/21: 5 times sit to stand: 30 sec; definite use of UE for both standing up and sitting down,  12/29/21: Timed up and go (TUG): 30 sec with rollator   GAIT: Distance walked: 50 Assistive device utilized: Environmental consultant - 4 wheeled Level of assistance: Modified independence Comments: severe contracturesss       TODAY'S TREATMENT:   12/29/21: Nustep x 5 min level 1, seat 8 Seated hamstring stretch 2 x 30 sec each LE Seated hip adductor stretch x5 hold 10 sec each Seated clam x 20 added green loop  1 x walk around ortho gym with Rollator, rest 2 min then repeat 90 feet each time.  LAQ and Marching in place x 20 with 2.5 lb  12-20-21 Nustep x  10 min level 1, seat 8, arms10 (314 steps) Seated hamstring stretch 2 x 30 sec each LE Seated hip adductor stretch x5 hold 10 sec each Seated clam x 20 Side sitting hip flexor stretch 2 x30 sec each LE LAQ and Marching in place x 20 with 2.5 lb  TODAY'S TREATMENT: 12-12-21 Initiated HEP and initial eval completed     PATIENT EDUCATION:  Education details: Initiated HEP Person educated: Patient Education method: Consulting civil engineer, Media planner, Verbal cues, and Handouts Education comprehension: verbalized understanding, returned demonstration, and tactile cues required     HOME EXERCISE PROGRAM: Access Code: [email or web address] URL: [email or web address] Date: 12/12/2021 Prepared by: Candyce Churn   Exercises - Supine Hip Adductor Stretch  - 1 x daily - 7 x weekly - 1 sets - 10 reps - 10 sec  hold - Supine Single Knee to Chest Stretch  - 1 x daily - 7 x weekly - 1 sets - 10 reps - 5 sec hold - Supine Lower Trunk Rotation  - 1 x daily - 7 x weekly - 1 sets - 20 reps - Hooklying Clamshell with Resistance  - 1 x daily - 7 x weekly - 3 sets - 10 reps   ASSESSMENT:   CLINICAL IMPRESSION: Pt arrives with no leg pain. He reports he has most leg pain in the morning. He is compliant with his HEP. He was able to walk 90 feet 2x today with his Rollator and no leg pain. TUG and 5x sit to stand performed today, see chart for data. Pt reported his LTLE was very tired and was physically rubbing it constantly last 5 min ( after second walk). Treatment was ended early which pt agreed to.    OBJECTIVE IMPAIRMENTS Abnormal gait, decreased balance, decreased endurance, difficulty walking, decreased strength, increased muscle spasms, impaired flexibility, postural dysfunction, and pain.    ACTIVITY LIMITATIONS carrying, lifting, bending, sitting, standing, squatting, sleeping, stairs, transfers, and dressing   PARTICIPATION LIMITATIONS: meal prep, cleaning, laundry, driving, shopping, community activity, and yard work   [place] and 1-2 comorbidities: HTN, post polio syndrome  are also affecting patient's functional outcome.    REHAB POTENTIAL: Good   CLINICAL DECISION MAKING: Stable/uncomplicated   EVALUATION COMPLEXITY: Low     GOALS: Goals reviewed with patient? Yes   SHORT TERM GOALS: Target date: 01/09/2022  Patient will be independent with initial HEP  Baseline: Goal status: 12/29/21 met 2.  Pain report to be no greater than 4/10  Baseline:  Goal status: Ongoing, had morning pain typically > 4/10   3.  Patient to be able to ambulate 100 feet with pain no greater than 4/10 Baseline:  Goal status: Ongoing, pt ambulated 90 feet 2x with his Rollator. Only fatigue and no pain.      LONG TERM GOALS: Target date: 02/06/2022    Patient to be independent with advanced HEP   Baseline:  Goal status: INITIAL   2.  Patient to report pain no greater than 2/10  Baseline:  Goal status: INITIAL   3.  FOTO to be  Baseline:  Goal status: INITIAL   4.  Patient to report 85% improvement in overall function Baseline:  Goal status: INITIAL   5.  Patient to improve 5 times sit to stand and TUG by 2 sec Baseline:  Goal status: INITIAL       PLAN: PT FREQUENCY: 1-2x/week   PT DURATION: 8 weeks   PLANNED INTERVENTIONS: Therapeutic exercises, Therapeutic activity, Neuromuscular re-education, Balance training, Gait training, Patient/Family  education, Joint mobilization, Stair training, DME instructions, Aquatic Therapy, Dry Needling, Electrical stimulation, Cryotherapy, Moist heat, Taping, Ultrasound, Ionotophoresis 72m/ml Dexamethasone, Manual therapy, and Re-evaluation   PLAN FOR NEXT SESSION: PT needs more appt    JMyrene Galas PTA 12/29/21 11:41 AM   [address] [place] [address]Phone # [phone] [phone]

## 2021-12-29 ENCOUNTER — Ambulatory Visit: Payer: Medicare (Managed Care) | Admitting: Physical Therapy

## 2021-12-29 ENCOUNTER — Encounter: Payer: Self-pay | Admitting: Physical Therapy

## 2021-12-29 DIAGNOSIS — R252 Cramp and spasm: Secondary | ICD-10-CM

## 2021-12-29 DIAGNOSIS — R262 Difficulty in walking, not elsewhere classified: Secondary | ICD-10-CM

## 2021-12-29 DIAGNOSIS — M79605 Pain in left leg: Secondary | ICD-10-CM | POA: Diagnosis not present

## 2021-12-29 DIAGNOSIS — M79604 Pain in right leg: Secondary | ICD-10-CM

## 2021-12-29 DIAGNOSIS — M6281 Muscle weakness (generalized): Secondary | ICD-10-CM

## 2022-01-03 ENCOUNTER — Ambulatory Visit (INDEPENDENT_AMBULATORY_CARE_PROVIDER_SITE_OTHER): Payer: Medicare (Managed Care) | Admitting: Podiatry

## 2022-01-03 ENCOUNTER — Encounter: Payer: Self-pay | Admitting: Podiatry

## 2022-01-03 DIAGNOSIS — M79676 Pain in unspecified toe(s): Secondary | ICD-10-CM

## 2022-01-03 DIAGNOSIS — B353 Tinea pedis: Secondary | ICD-10-CM

## 2022-01-03 DIAGNOSIS — B351 Tinea unguium: Secondary | ICD-10-CM | POA: Diagnosis not present

## 2022-01-05 ENCOUNTER — Encounter: Payer: Self-pay | Admitting: Physical Therapy

## 2022-01-05 ENCOUNTER — Ambulatory Visit: Payer: Medicare (Managed Care) | Attending: Family Medicine | Admitting: Physical Therapy

## 2022-01-05 DIAGNOSIS — R262 Difficulty in walking, not elsewhere classified: Secondary | ICD-10-CM | POA: Insufficient documentation

## 2022-01-05 DIAGNOSIS — R252 Cramp and spasm: Secondary | ICD-10-CM | POA: Diagnosis present

## 2022-01-05 DIAGNOSIS — M79604 Pain in right leg: Secondary | ICD-10-CM | POA: Insufficient documentation

## 2022-01-05 DIAGNOSIS — M6281 Muscle weakness (generalized): Secondary | ICD-10-CM | POA: Insufficient documentation

## 2022-01-05 NOTE — Therapy (Signed)
OUTPATIENT PHYSICAL THERAPY TREATMENT NOTE   Patient Name: Austin Perez MRN: [id number] DOB:24-Sep-1940, 81 y.o., male Today's Date: 01/05/2022  PCP:   Nickola Major, MD   REFERRING PROVIDER:   Nickola Major, MD    END OF SESSION:   PT End of Session - 01/05/22 1048     Visit Number 4    Date for PT Re-Evaluation 02/06/22    Authorization Type [employer] and [place] Time Period 01/08/22-9/10-23    Authorization - Visit Number 3    Authorization - Number of Visits 13    Progress Note Due on Visit 10    PT Start Time 3016    PT Stop Time 1127    PT Time Calculation (min) 38 min    Activity Tolerance Patient tolerated treatment well;Patient limited by fatigue    Behavior During Therapy [facility] for tasks assessed/performed               Past Medical History:  Diagnosis Date   COPD (chronic obstructive pulmonary disease) ([place])    Eczema    Polio    Past Surgical History:  Procedure Laterality Date   HERNIA REPAIR     Patient Active Problem List   Diagnosis Date Noted   Aortic atherosclerosis ([place]) 05/17/2021   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   Allergic eosinophilia 07/30/2016   Arthritis 05/02/2016   Benign prostatic hyperplasia with lower urinary tract symptoms 11/09/2015   Edema of both legs 11/09/2015   Allergic rhinitis 11/08/2015   COPD (chronic obstructive pulmonary disease) ([place]) 11/08/2015   Eczema 11/08/2015   Resting tremor 11/08/2015    REFERRING DIAG: M79.605 (ICD-10-CM) - Pain in left leg   THERAPY DIAG:  Pain in right leg  Difficulty in walking, not elsewhere classified  Cramp and spasm  Muscle weakness (generalized)  Rationale for Evaluation and Treatment Rehabilitation  PERTINENT HISTORY: post polio  PRECAUTIONS: post polio  SUBJECTIVE: Pt was approved for 10 additional visits: 01/08/22-02/11/22/  I feel rough today, my legs are already tired and sore.   PAIN:  Are you having pain? Legs are tired and a little sore  2-3/10. Just woke up like that.    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  Patient has functional ROM throughout.    LOWER EXTREMITY MMT:   Generally 2+ to 3+ /5 bilaterally     FUNCTIONAL TESTS:  12/29/21: 5 times sit to stand: 30 sec; definite use of UE for both standing up and sitting down,  12/29/21: Timed up and go (TUG): 30 sec with rollator   GAIT: Distance walked: 50 Assistive device utilized: Environmental consultant - 4 wheeled Level of assistance: Modified independence Comments: severe contracturesss       TODAY'S TREATMENT:   01/05/22: Nustep x 6 min level 1, seat 8 Seated hamstring stetch 5x with short hold Bil,  VC to pull toes back Seated clamshell try with blue loop; 10x little movement of RT hip LAq 2x10 no resistance today secondary to fatigue Seated marching:2x10 no weights today, LE too fatigued Sit to stand from mat table using Rollator. 2x5. Pt was able to use his LE just as much as his UE.  3 laps around the ortho gym with 1-2 min seated in between. Pt used Rollator. Pt reports no pain with walking just fatigue.   12/29/21: Nustep x 5 min level 1, seat 8 Seated hamstring stretch 2 x 30 sec each LE Seated hip adductor stretch x5 hold 10 sec each Seated clam x 20 added green loop  1 x walk around ortho gym with Rollator, rest 2 min then repeat 90 feet each time.  LAQ and Marching in place x 20 with 2.5 lb  12-20-21 Nustep x 10 min level 1, seat 8, arms10 (314 steps) Seated hamstring  stretch 2 x 30 sec each LE Seated hip adductor stretch x5 hold 10 sec each Seated clam x 20 Side sitting hip flexor stretch 2 x30 sec each LE LAQ and Marching in place x 20 with 2.5 lb  TODAY'S TREATMENT: 12-12-21 Initiated HEP and initial eval completed     PATIENT EDUCATION:  Education details: Initiated HEP Person educated: Patient Education method: Consulting civil engineer, Media planner, Verbal cues, and Handouts Education comprehension: verbalized understanding, returned demonstration, and tactile cues required     HOME EXERCISE PROGRAM: Access Code: [email or web address] URL: [email or web address] Date: 12/12/2021 Prepared by: Candyce Churn   Exercises - Supine Hip Adductor Stretch  - 1 x daily - 7 x weekly - 1 sets - 10 reps - 10 sec hold - Supine Single Knee to Chest Stretch  - 1 x daily - 7 x weekly - 1 sets - 10 reps - 5 sec hold - Supine Lower Trunk Rotation  - 1 x daily - 7 x weekly - 1 sets - 20 reps - Hooklying Clamshell with Resistance  - 1 x daily - 7 x weekly - 3 sets - 10 reps   ASSESSMENT:   CLINICAL IMPRESSION: Pt arrives with complaints of legs feeling rough already, clarifying that meant mostly fatigue and just a little soreness. Pt reports moving his legs with the exercises made them feel better but still tired. Pt ambulated the ortho gym 3 separate times using Rollator with no pain but fatigue persisted. We held on using weights on his E due to coming into PT session already very fatigued in his legs.    OBJECTIVE IMPAIRMENTS Abnormal gait, decreased balance, decreased endurance, difficulty walking, decreased strength, increased muscle spasms, impaired flexibility, postural dysfunction, and pain.    ACTIVITY LIMITATIONS carrying, lifting, bending, sitting, standing, squatting, sleeping, stairs, transfers, and dressing   PARTICIPATION LIMITATIONS: meal prep, cleaning, laundry, driving, shopping, community activity, and yard work   [place] and 1-2  comorbidities: HTN, post polio syndrome  are also affecting patient's functional outcome.    REHAB POTENTIAL: Good   CLINICAL DECISION MAKING: Stable/uncomplicated   EVALUATION COMPLEXITY: Low     GOALS: Goals reviewed with patient? Yes   SHORT TERM GOALS: Target date: 01/09/2022  Patient will be independent with initial HEP  Baseline: Goal status: 12/29/21 met 2.  Pain report to be no greater than 4/10  Baseline:  Goal status: Ongoing, had morning pain typically > 4/10   3.  Patient to be able to ambulate 100 feet with pain no greater than 4/10 Baseline:  Goal status: Ongoing, pt  ambulated 90 feet 2x with his Rollator. Only fatigue and no pain.      LONG TERM GOALS: Target date: 02/06/2022    Patient to be independent with advanced HEP  Baseline:  Goal status: INITIAL   2.  Patient to report pain no greater than 2/10  Baseline:  Goal status: INITIAL   3.  FOTO to be  Baseline:  Goal status: INITIAL   4.  Patient to report 85% improvement in overall function Baseline:  Goal status: INITIAL   5.  Patient to improve 5 times sit to stand and TUG by 2 sec Baseline:  Goal status: INITIAL       PLAN: PT FREQUENCY: 1-2x/week   PT DURATION: 8 weeks   PLANNED INTERVENTIONS: Therapeutic exercises, Therapeutic activity, Neuromuscular re-education, Balance training, Gait training, Patient/Family education, Joint mobilization, Stair training, DME instructions, Aquatic Therapy, Dry Needling, Electrical stimulation, Cryotherapy, Moist heat, Taping, Ultrasound, Ionotophoresis 16m/ml Dexamethasone, Manual therapy, and Re-evaluation   PLAN FOR NEXT SESSION: LE strength, functional strength, walking with rollator. Pt may only want to complete 5 more visits but he was verbally told he was approved for 10.   JMyrene Galas PTA 01/05/22 11:30 AM   [address] [place] [place] [address]Phone # [phone] [phone]

## 2022-01-08 NOTE — Progress Notes (Signed)
  Subjective:  Patient ID: Austin Perez, male    DOB: 04-23-1941,  MRN: [id number]  Austin Perez presents to clinic today for painful elongated mycotic toenails 1-5 bilaterally which are tender when wearing enclosed shoe gear. Pain is relieved with periodic professional debridement.  Patient's brother is present during today's visit.  New problem(s): None.   PCP is Gwenlyn Found, MD , and last visit was  November 24, 2021  No Known Allergies  Review of Systems: Negative except as noted in the HPI.  Objective: No changes noted in today's physical examination.  Vascular Examination: Palpable pedal pulses b/l LE. Digital hair present b/l. No pedal edema b/l. Skin temperature gradient WNL b/l. No varicosities b/l.  Dermatological Examination: Pedal skin with normal turgor, texture and tone b/l. No open wounds. Toenails 1-5 b/l thickened, discolored, dystrophic with subungual debris. There is pain on palpation to dorsal aspect of nailplates. No hyperkeratotic nor porokeratotic lesions present on today's visit.  Webspaces 1-4 b/l with interdigital maceration. No open wounds. No erythema, no edema, no drainage, no cellulitis.  Neurological Examination: Protective sensation intact with 10 gram monofilament b/l LE. Vibratory sensation intact b/l LE.   Musculoskeletal Examination: Muscle strength 5/5 to all LE muscle groups b/l. No pain, crepitus or joint limitation noted with ROM bilateral LE. Hammertoe deformity noted 2-5 b/l.  Assessment/Plan: 1. Pain due to onychomycosis of toenail   2. Tinea pedis of both feet   -Patient was evaluated and treated. All patient's and/or POA's questions/concerns answered on today's visit. -Betadine paint applied to webspaces today. -Continue foot and shoe inspections daily. Monitor blood glucose per PCP/Endocrinologist's recommendations. -Mycotic toenails 1-5 bilaterally were debrided in length and girth with sterile nail nippers and dremel without  incident. -Patient instructed to purchase OTC antifungal spray powder between toes once daily. -Patient/POA to call should there be question/concern in the interim.   Return in about 3 months (around 04/05/2022).  Freddie Breech, DPM

## 2022-01-12 ENCOUNTER — Encounter: Payer: Self-pay | Admitting: Physical Therapy

## 2022-01-12 ENCOUNTER — Ambulatory Visit: Payer: Medicare (Managed Care) | Admitting: Physical Therapy

## 2022-01-12 DIAGNOSIS — R262 Difficulty in walking, not elsewhere classified: Secondary | ICD-10-CM

## 2022-01-12 DIAGNOSIS — M6281 Muscle weakness (generalized): Secondary | ICD-10-CM

## 2022-01-12 DIAGNOSIS — R252 Cramp and spasm: Secondary | ICD-10-CM

## 2022-01-12 DIAGNOSIS — M79604 Pain in right leg: Secondary | ICD-10-CM | POA: Diagnosis not present

## 2022-01-12 NOTE — Therapy (Addendum)
OUTPATIENT PHYSICAL THERAPY TREATMENT NOTE   Patient Name: Khairi Garman MRN: [id number] DOB:06-Dec-1940, 81 y.o., male Today's Date: 01/12/2022  PCP:   Nickola Major, MD   REFERRING PROVIDER:   Nickola Major, MD    END OF SESSION:   PT End of Session - 01/12/22 1015     Visit Number 5    Date for PT Re-Evaluation 02/06/22    Authorization Type [employer] and [place] Time Period 01/08/22-9/10-23    Authorization - Visit Number 4    Authorization - Number of Visits 13    Progress Note Due on Visit 10    PT Start Time 9622    PT Stop Time 1053    PT Time Calculation (min) 38 min    Activity Tolerance Patient tolerated treatment well;Patient limited by fatigue    Behavior During Therapy [facility] for tasks assessed/performed                Past Medical History:  Diagnosis Date   COPD (chronic obstructive pulmonary disease) ([place])    Eczema    Polio    Past Surgical History:  Procedure Laterality Date   HERNIA REPAIR     Patient Active Problem List   Diagnosis Date Noted   Aortic atherosclerosis ([place]) 05/17/2021   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   Allergic eosinophilia 07/30/2016   Arthritis 05/02/2016   Benign prostatic hyperplasia with lower urinary tract symptoms 11/09/2015   Edema of both legs 11/09/2015   Allergic rhinitis 11/08/2015   COPD (chronic obstructive pulmonary disease) ([place]) 11/08/2015   Eczema 11/08/2015   Resting tremor 11/08/2015    REFERRING DIAG: M79.605 (ICD-10-CM) - Pain in left leg   THERAPY DIAG:  Pain in right leg  Difficulty in walking, not elsewhere classified  Cramp and spasm  Muscle weakness (generalized)  Rationale for Evaluation and Treatment Rehabilitation  PERTINENT HISTORY: post polio  PRECAUTIONS: post polio  SUBJECTIVE: I was ok when I left here last time. Today my thigh muscle is hurting. I took some pain pills before I came and it is helping.   PAIN:  Are you having pain? Legs are tired and a  little sore 5/10. Just woke up like that.    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  Patient has functional ROM throughout.    LOWER EXTREMITY MMT:   Generally 2+ to 3+ /5 bilaterally     FUNCTIONAL TESTS:  12/29/21: 5 times sit to stand: 30 sec; definite use of UE for both standing up and sitting down,  12/29/21: Timed up and go (TUG): 30 sec with rollator   GAIT: Distance walked: 50 Assistive device utilized: Walker - 4 wheeled Level of assistance: Modified independence Comments: severe contracturesss       TODAY'S TREATMENT:   01/12/22: Nustep: L1 2 min work, 2 min rest:  did this for approx 10 min. This seemed to help adjust for pain and fatigue in his RTLE.  At [facility] seated: RTLE toe taps on 4" box 10x forward, 10x side tap Green loop above knee: at [employer] 10x LAQ "kick the wall" 1# Bil 10x Sit to stand: chair to [place] 2x5: VC to use LE as much as he can when standing, has to use UE more for sitting. 4 laps around the ortho gym with 1-2 min seated in between. Pt used Rollator. Pt reports no pain with walking just fatigue. Average speed of each lap: 1 min  01/05/22: Nustep x 6 min level 1, seat 8 Seated hamstring stetch 5x with short hold Bil, VC to pull toes back Seated clamshell try with blue loop; 10x little movement of RT hip LAq 2x10 no resistance today secondary to fatigue Seated marching:2x10 no weights today, LE too fatigued Sit to stand from mat table using Rollator. 2x5.  Pt was able to use his LE just as much as his UE.  3 laps around the ortho gym with 1-2 min seated in between. Pt used Rollator. Pt reports no pain with walking just fatigue.   12/29/21: Nustep x 5 min level 1, seat 8 Seated hamstring stretch 2 x 30 sec each LE Seated hip adductor stretch x5 hold 10 sec each Seated clam x 20 added green loop  1 x walk around ortho gym with Rollator, rest 2 min then repeat 90 feet each time.  LAQ and Marching in place x 20 with 2.5 lb   TODAY'S TREATMENT: 12-12-21 Initiated HEP and initial eval completed     PATIENT EDUCATION:  Education details: Initiated HEP Person educated: Patient Education method: Consulting civil engineer, Media planner, Verbal cues, and Handouts Education comprehension: verbalized understanding, returned demonstration, and tactile cues required     HOME EXERCISE PROGRAM: Access Code: [email or web address] URL: [email or web address] Date: 12/12/2021 Prepared by: Candyce Churn   Exercises - Supine Hip Adductor Stretch  - 1 x daily - 7 x weekly - 1 sets - 10 reps - 10 sec hold - Supine Single Knee to Chest Stretch  - 1 x daily - 7 x weekly - 1 sets - 10 reps - 5 sec hold - Supine Lower Trunk Rotation  - 1 x daily - 7 x weekly - 1 sets - 20 reps - Hooklying Clamshell with Resistance  - 1 x daily - 7 x weekly - 3 sets - 10 reps   ASSESSMENT:   CLINICAL IMPRESSION: Pt reports since doing the exercises at home his legs don't hurt as much walking around the house. Pt able to ambulate 4 laps today with his Rollator with avg speed around 1 min each and no increased knee pain. Pt felt the exercises helped reduce his pain.    OBJECTIVE IMPAIRMENTS Abnormal gait, decreased balance, decreased endurance, difficulty walking, decreased strength, increased muscle spasms, impaired flexibility, postural dysfunction, and pain.    ACTIVITY LIMITATIONS carrying, lifting, bending, sitting, standing, squatting, sleeping, stairs, transfers, and dressing    PARTICIPATION LIMITATIONS: meal prep, cleaning, laundry, driving, shopping, community activity, and yard work   [place] and 1-2 comorbidities: HTN, post polio syndrome  are also affecting patient's functional outcome.    REHAB POTENTIAL: Good   CLINICAL DECISION MAKING: Stable/uncomplicated   EVALUATION COMPLEXITY: Low     GOALS: Goals reviewed with patient? Yes   SHORT TERM GOALS: Target date: 01/09/2022  Patient will be independent with initial HEP  Baseline: Goal status: 12/29/21 met 2.  Pain report to be no greater than 4/10  Baseline:  Goal status: Ongoing, had morning pain typically > 4/10   3.  Patient to be able to ambulate 100 feet with pain no greater than 4/10 Baseline:  Goal status: Ongoing, pt ambulated 90 feet 2x with his Rollator. Only fatigue and no pain.      LONG TERM GOALS: Target date: 02/06/2022    Patient to be independent with advanced HEP  Baseline:  Goal status: INITIAL   2.  Patient to report pain no greater than 2/10  Baseline:  Goal status: INITIAL   3.  FOTO to be  Baseline:  Goal status: INITIAL   4.  Patient to report 85% improvement in overall function Baseline:  Goal status: INITIAL   5.  Patient to improve 5 times sit to stand and TUG by 2 sec Baseline:  Goal status: INITIAL       PLAN: PT FREQUENCY: 1-2x/week   PT DURATION: 8 weeks   PLANNED INTERVENTIONS: Therapeutic exercises, Therapeutic activity, Neuromuscular re-education, Balance training, Gait training, Patient/Family education, Joint mobilization, Stair training, DME instructions, Aquatic Therapy, Dry Needling, Electrical stimulation, Cryotherapy, Moist heat, Taping, Ultrasound, Ionotophoresis 70m/ml Dexamethasone, Manual therapy, and Re-evaluation   PLAN FOR NEXT SESSION: Update HEP, TUG check, 5x sit to stand check  PHYSICAL THERAPY DISCHARGE SUMMARY  Visits from Start of Care: 5  Current functional level related to goals / functional  outcomes: See above   Remaining deficits: See above   Education / Equipment: See above   Patient agrees to discharge. Patient goals were partially met. Patient is being discharged due to financial reasons.    JAnderson MaltaB. Fields, PT 04/05/22 3:49 PM   [address] [place] [address]Phone # [phone] [phone]

## 2022-01-24 ENCOUNTER — Ambulatory Visit: Payer: Medicare (Managed Care)

## 2022-01-24 DIAGNOSIS — M79604 Pain in right leg: Secondary | ICD-10-CM

## 2022-01-24 DIAGNOSIS — M6281 Muscle weakness (generalized): Secondary | ICD-10-CM

## 2022-01-24 DIAGNOSIS — R262 Difficulty in walking, not elsewhere classified: Secondary | ICD-10-CM

## 2022-01-24 DIAGNOSIS — R252 Cramp and spasm: Secondary | ICD-10-CM

## 2022-01-24 NOTE — Therapy (Addendum)
Patient arrived for appt but insurance that was listed as his primary is not in network.  He had medicaid as secondary but this is also being denied.  Unsure if patient will need to transition to new facility that accepts his primary insurance or if appeal with medicaid will be successful.  Patient and brother were informed and opted to hold on any further PT at this time until we contact insurance to see if appeal is approved.  Explained that if the appeal is still denied, we would suggest a different facility that accepts his primary insurance.

## 2022-02-02 ENCOUNTER — Encounter: Payer: Medicare (Managed Care) | Admitting: Physical Therapy

## 2022-02-09 ENCOUNTER — Encounter: Payer: Medicare (Managed Care) | Admitting: Physical Therapy

## 2022-04-18 ENCOUNTER — Encounter: Payer: Self-pay | Admitting: Podiatry

## 2022-04-18 ENCOUNTER — Ambulatory Visit (INDEPENDENT_AMBULATORY_CARE_PROVIDER_SITE_OTHER): Payer: Medicare (Managed Care) | Admitting: Podiatry

## 2022-04-18 DIAGNOSIS — M79676 Pain in unspecified toe(s): Secondary | ICD-10-CM | POA: Diagnosis not present

## 2022-04-18 DIAGNOSIS — B351 Tinea unguium: Secondary | ICD-10-CM

## 2022-04-23 NOTE — Progress Notes (Signed)
  Subjective:  Patient ID: Austin Perez, male    DOB: 07/08/1940,  MRN: [id number]  [name] presents to clinic today for:  Chief Complaint  Patient presents with   Nail Problem    Routine foot care PCP-Eksir PCP VST- 6 months ago  Patient is accompanied by his brother on today's visit.  PCP is Gwenlyn Found, MD.  No Known Allergies  Review of Systems: Negative except as noted in the HPI.  Objective: No changes noted in today's physical examination. [name] is a pleasant 81 y.o. male in NAD. AAO x 3.  Vascular Examination: Capillary refill time <3 seconds b/l LE. Palpable pedal pulses b/l LE. Digital hair present b/l. No pedal edema b/l. Skin temperature gradient WNL b/l. No varicosities b/l. Marland Kitchen  Dermatological Examination: Pedal skin with normal turgor, texture and tone b/l. No open wounds. No interdigital macerations b/l. Toenails 1-5 b/l thickened, discolored, dystrophic with subungual debris. There is pain on palpation to dorsal aspect of nailplates. .  Neurological Examination: Protective sensation intact with 10 gram monofilament b/l LE. Vibratory sensation intact b/l LE.   Musculoskeletal Examination: Muscle strength 5/5 to all LE muscle groups b/l. Hammertoe deformity noted 2-5 b/l.  Assessment/Plan: 1. Pain due to onychomycosis of toenail     No orders of the defined types were placed in this encounter.   -Patient's family member present. All questions/concerns addressed on today's visit. -Patient to continue soft, supportive shoe gear daily. -Toenails 1-5 b/l were debrided in length and girth with sterile nail nippers and dremel without iatrogenic bleeding.  -Patient/POA to call should there be question/concern in the interim.   Return in about 3 months (around 07/19/2022).  Freddie Breech, DPM

## 2022-08-13 ENCOUNTER — Ambulatory Visit (INDEPENDENT_AMBULATORY_CARE_PROVIDER_SITE_OTHER): Payer: Medicare (Managed Care) | Admitting: Podiatry

## 2022-08-13 DIAGNOSIS — B351 Tinea unguium: Secondary | ICD-10-CM | POA: Diagnosis not present

## 2022-08-13 DIAGNOSIS — M79676 Pain in unspecified toe(s): Secondary | ICD-10-CM | POA: Diagnosis not present

## 2022-08-15 ENCOUNTER — Encounter: Payer: Self-pay | Admitting: Podiatry

## 2022-08-15 NOTE — Progress Notes (Signed)
  Subjective:  Patient ID: Austin Perez, male    DOB: 1941-05-17,  MRN: [id number]  Austin Perez presents to clinic today for painful thick toenails that are difficult to trim. Pain interferes with ambulation. Aggravating factors include wearing enclosed shoe gear. Pain is relieved with periodic professional debridement.   He is accompanied by his brother on today's visit. Chief Complaint  Patient presents with   Nail Problem    RFC PCP-Eksir PCP VST-2023   New problem(s): None.   PCP is Nickola Major, MD.  No Known Allergies  Review of Systems: Negative except as noted in the HPI.  Objective: No changes noted in today's physical examination. There were no vitals filed for this visit. Austin Perez is a pleasant 82 y.o. male WD, WN in NAD. AAO x 3.  Vascular Examination: Capillary refill time <3 seconds b/l LE. Palpable pedal pulses b/l LE. Digital hair present b/l. No pedal edema b/l. Skin temperature gradient WNL b/l. No varicosities b/l. Marland Kitchen  Dermatological Examination: Pedal skin with normal turgor, texture and tone b/l. No open wounds. No interdigital macerations b/l. Toenails 1-5 b/l thickened, discolored, dystrophic with subungual debris. There is pain on palpation to dorsal aspect of nailplates. .  Neurological Examination: Protective sensation intact with 10 gram monofilament b/l LE. Vibratory sensation intact b/l LE.   Musculoskeletal Examination: Muscle strength 5/5 to all LE muscle groups b/l. Hammertoe deformity noted 2-5 b/l.  Assessment/Plan: 1. Pain due to onychomycosis of toenail     -Patient was evaluated and treated. All patient's and/or POA's questions/concerns answered on today's visit. -Continue supportive shoe gear daily. -Mycotic toenails 1-5 bilaterally were debrided in length and girth with sterile nail nippers and dremel without incident. -Patient/POA to call should there be question/concern in the interim.   Return in about 3 months  (around 11/13/2022).  Marzetta Board, DPM

## 2022-11-06 IMAGING — CR DG CHEST 2V
2 series · 2 of 2 positions shown · non-contrast
Comparison: 08/08/2016

CLINICAL DATA: Chronic obstructive pulmonary disease of unspecified
type, shortness of breath, former smoker

EXAM:
CHEST - 2 VIEW

[w chest lat]
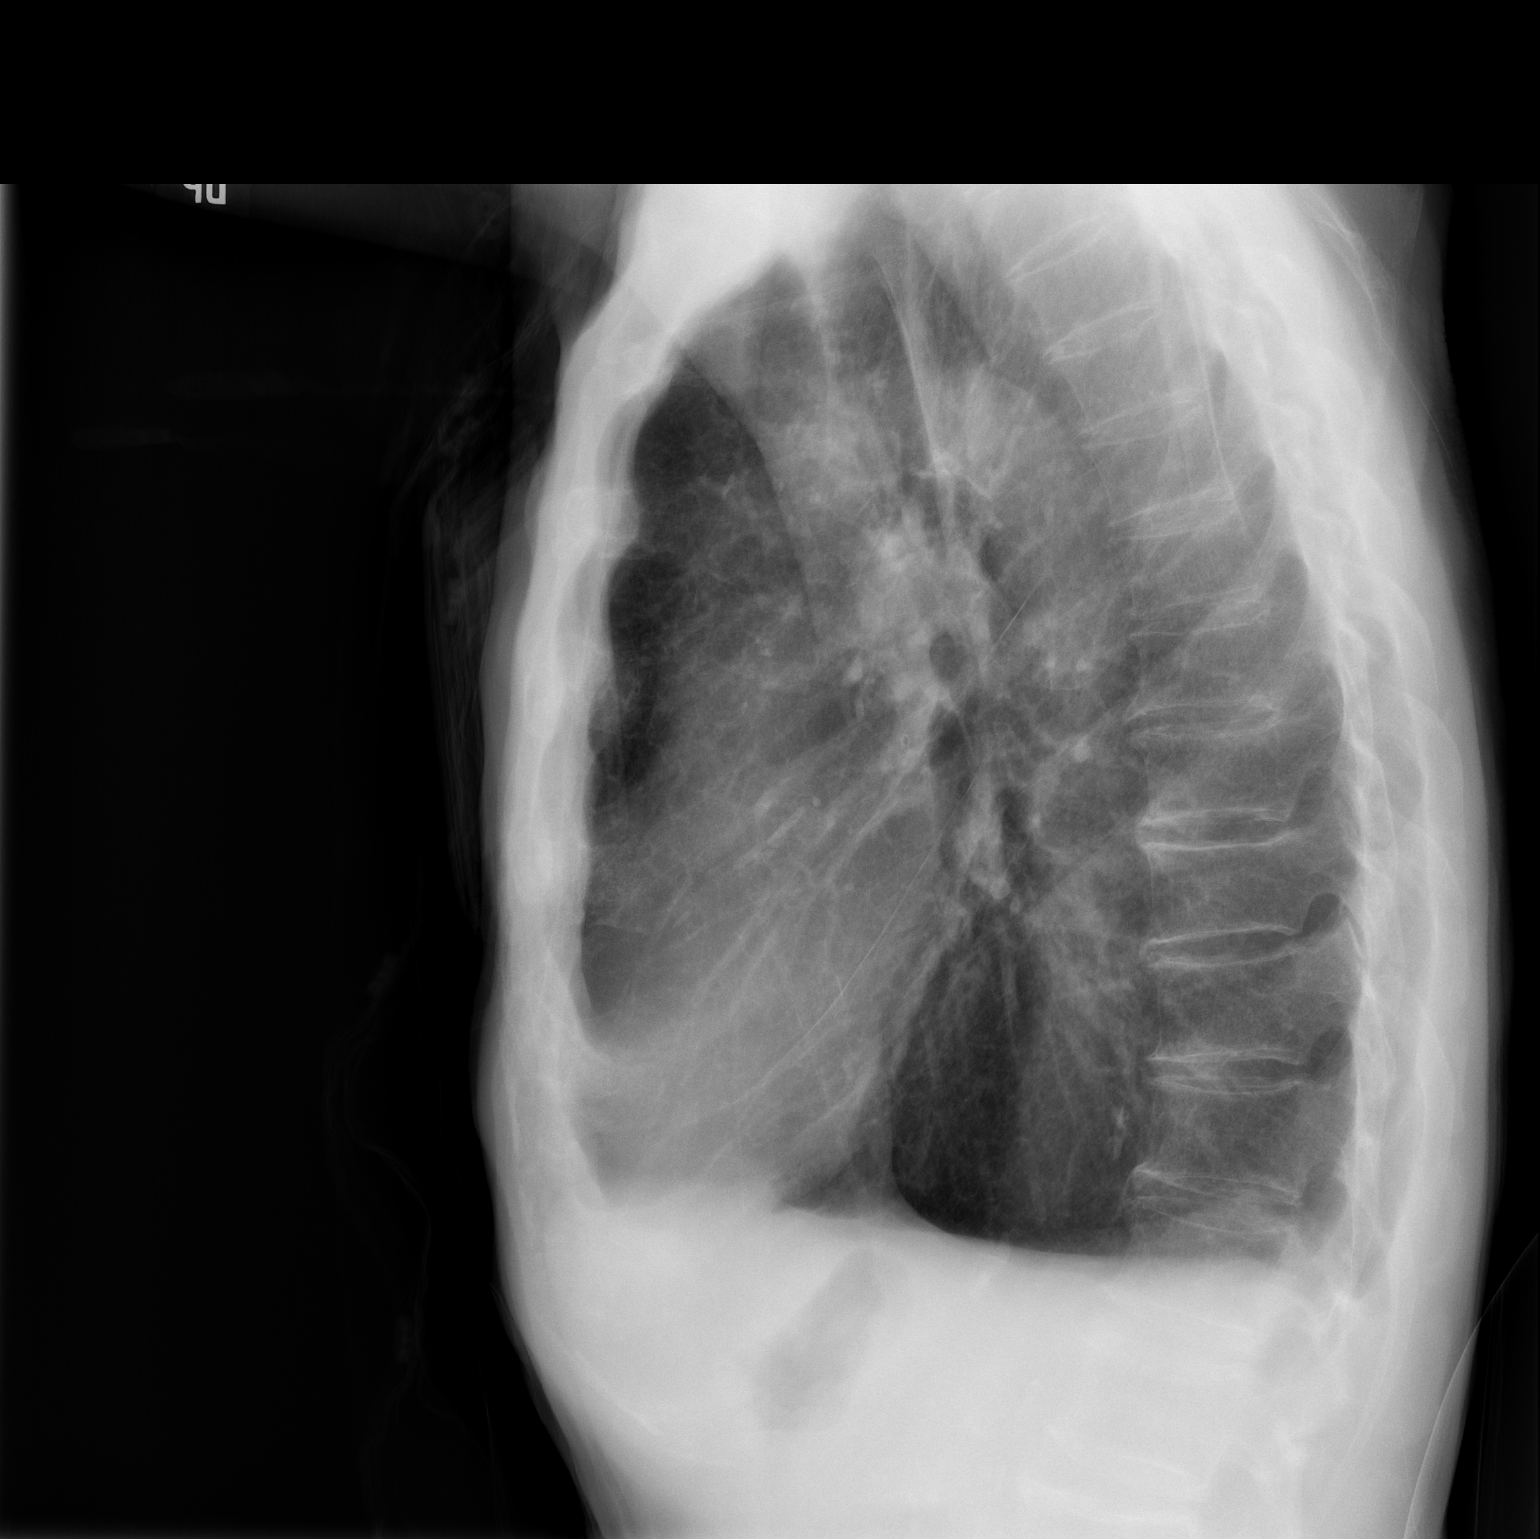

[w chest ap]
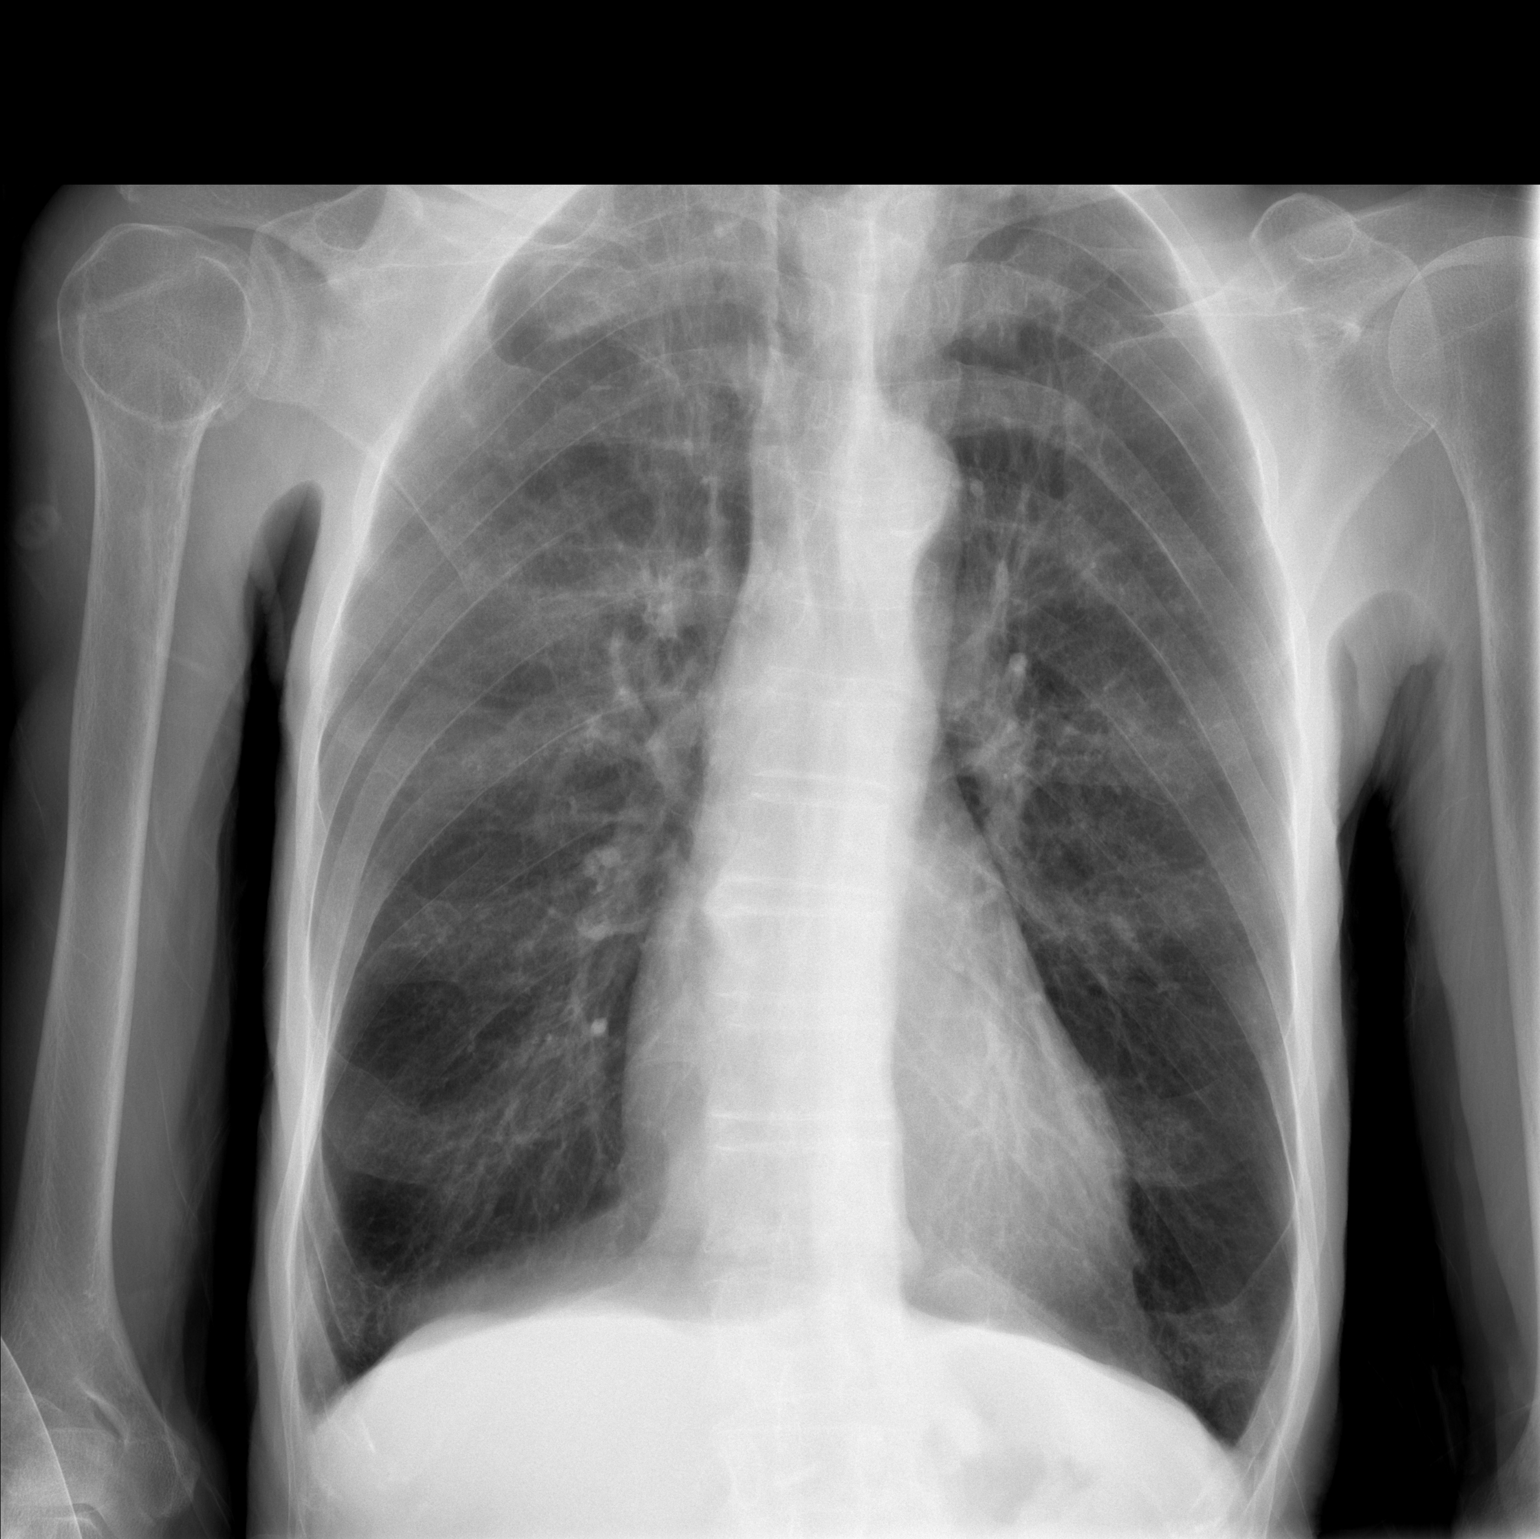

[2 of 2 positions shown; findings below may reference images not displayed]

FINDINGS: Normal heart size, mediastinal contours, and pulmonary vascularity.

Atherosclerotic calcification aorta.

Emphysematous changes and hyperinflation consistent with COPD.

Mild RIGHT apical scarring.

Lungs otherwise clear.

No pulmonary infiltrate, pleural effusion, or pneumothorax.

Osseous demineralization.
IMPRESSION: COPD changes with mild RIGHT apical scarring.

No acute abnormalities.

Aortic Atherosclerosis (GCWQB-WJL.L) and Emphysema ([id number].J).

## 2022-12-11 ENCOUNTER — Ambulatory Visit (INDEPENDENT_AMBULATORY_CARE_PROVIDER_SITE_OTHER): Payer: Self-pay | Admitting: Podiatry

## 2022-12-11 VITALS — BP 131/57

## 2022-12-11 DIAGNOSIS — B351 Tinea unguium: Secondary | ICD-10-CM

## 2022-12-11 DIAGNOSIS — M79676 Pain in unspecified toe(s): Secondary | ICD-10-CM

## 2022-12-11 NOTE — Progress Notes (Signed)
  Subjective:  Patient ID: Austin Perez, male    DOB: Jul 31, 1940,  MRN: [id number]  Austin Perez presents to clinic today for: painful elongated mycotic toenails 1-5 bilaterally which are tender when wearing enclosed shoe gear. Pain is relieved with periodic professional debridement. His brother is present during today's visit. Chief Complaint  Patient presents with   Nail Problem    RFC    PCP is Eksir, Nira Retort, MD.  No Known Allergies  Review of Systems: Negative except as noted in the HPI.  Objective: No changes noted in today's physical examination. Vitals:   12/11/22 1312  BP: (!) 131/57    Austin Perez is a pleasant 82 y.o. male in NAD. AAO x 3.  Vascular Examination: Capillary refill time <3 seconds b/l LE. Palpable pedal pulses b/l LE. Digital hair present b/l. No pedal edema b/l. Skin temperature gradient WNL b/l. No varicosities b/l. No cyanosis or clubbing noted b/l LE.Marland Kitchen  Dermatological Examination: Pedal skin with normal turgor, texture and tone b/l. No open wounds. No interdigital macerations b/l. Toenails 1-5 b/l thickened, discolored, dystrophic with subungual debris. There is pain on palpation to dorsal aspect of nailplates. No hyperkeratotic nor porokeratotic lesions present on today's visit.Marland Kitchen  Neurological Examination: Protective sensation intact with 10 gram monofilament b/l LE. Vibratory sensation intact b/l LE.   Musculoskeletal Examination: Normal muscle strength 5/5 to all lower extremity muscle groups bilaterally. Hammertoe deformity noted 2-5 b/l. No pain, crepitus or joint limitation noted with ROM b/l LE.  Patient ambulates with rollator assistance.  Assessment/Plan: 1. Pain due to onychomycosis of toenail     -Patient was evaluated and treated. All patient's and/or POA's questions/concerns answered on today's visit. -No new findings. No new orders. -Continue supportive shoe gear daily. -Toenails 1-5 b/l were debrided in length and girth  with sterile nail nippers and dremel without iatrogenic bleeding.  -Patient/POA to call should there be question/concern in the interim.   Return in about 3 months (around 03/13/2023).  Freddie Breech, DPM

## 2022-12-16 ENCOUNTER — Encounter: Payer: Self-pay | Admitting: Podiatry

## 2023-03-20 ENCOUNTER — Ambulatory Visit: Payer: Medicare (Managed Care) | Admitting: Podiatry

## 2023-04-19 ENCOUNTER — Emergency Department (HOSPITAL_BASED_OUTPATIENT_CLINIC_OR_DEPARTMENT_OTHER)
Admission: EM | Admit: 2023-04-19 | Discharge: 2023-04-19 | Disposition: A | Discharge status: Home / Self Care | Payer: Medicare (Managed Care) | Attending: Emergency Medicine | Admitting: Emergency Medicine

## 2023-04-19 ENCOUNTER — Encounter (HOSPITAL_BASED_OUTPATIENT_CLINIC_OR_DEPARTMENT_OTHER): Payer: Self-pay | Admitting: Emergency Medicine

## 2023-04-19 ENCOUNTER — Other Ambulatory Visit (HOSPITAL_BASED_OUTPATIENT_CLINIC_OR_DEPARTMENT_OTHER): Payer: Self-pay

## 2023-04-19 DIAGNOSIS — M62838 Other muscle spasm: Secondary | ICD-10-CM | POA: Insufficient documentation

## 2023-04-19 DIAGNOSIS — M25552 Pain in left hip: Secondary | ICD-10-CM | POA: Diagnosis present

## 2023-04-19 DIAGNOSIS — M545 Low back pain, unspecified: Secondary | ICD-10-CM | POA: Insufficient documentation

## 2023-04-19 MED ORDER — METHOCARBAMOL 500 MG PO TABS
500.0000 mg | ORAL_TABLET | Freq: Two times a day (BID) | ORAL | 0 refills | Status: AC
  Filled 2023-04-19: qty 20, 10d supply, fill #0

## 2023-04-19 MED ORDER — KETOROLAC TROMETHAMINE 30 MG/ML IJ SOLN
30.0000 mg | Freq: Once | INTRAMUSCULAR | Status: AC
  Administered 2023-04-19: 30 mg via INTRAMUSCULAR
  Filled 2023-04-19: qty 1

## 2023-04-19 MED ORDER — METHOCARBAMOL 500 MG PO TABS
500.0000 mg | ORAL_TABLET | Freq: Once | ORAL | Status: AC
  Administered 2023-04-19: 500 mg via ORAL
  Filled 2023-04-19: qty 1

## 2023-04-19 NOTE — ED Triage Notes (Signed)
Pt arrived via [facility] for R back pain/L hip pain that started today. Took Ibuprofen and Tylenol. 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   EMS vitals:  BP 168/82 HR 90 16 RR 91% O2 RA 123 CBG

## 2023-04-19 NOTE — ED Provider Notes (Signed)
[place] EMERGENCY DEPARTMENT AT [facility] Provider Note   CSN: [id number] Arrival date & time: 04/19/23  1008     History  Chief Complaint  Patient presents with   Back Pain   Hip Pain    Austin Perez is a 82 y.o. male.  Pt is a 82y/o male with PMH of polio with residual weakness, BPH, and emphysema here today for worsening back and left hip pain.  Patient reports that he gets this pain intermittently but it normally goes away however today the pain is more severe than baseline.  He reports it is causing him to cramp up and makes it feel like his leg is drying up.  He tried some Tylenol and ibuprofen last night but the pain has been persistent today.  He has not had any falls denies any new location of pain.  No abdominal pain nausea or vomiting.  States he is still able to walk and eventually when he gets going it kind of improves a little bit.  He does not have to take medicine all the time but will usually take ibuprofen and Tylenol when the pain starts.  He did physical therapy last year but his insurance did not pay for much and he is not in anything now.  He has not had any recent back injections or surgeries.  No fevers, urinary changes or weakness that is different than his baseline which is chronic right lower extremity weakness.  The history is provided by the patient and medical records.  Back Pain Hip Pain       Home Medications Prior to Admission medications   Medication Sig Start Date End Date Taking? Authorizing Provider  methocarbamol (ROBAXIN) 500 MG tablet Take 1 tablet (500 mg total) by mouth 2 (two) times daily. 04/19/23  Yes Nathaly Dawkins, Alphonzo Lemmings, MD  albuterol (PROVENTIL HFA;VENTOLIN HFA) 108 (90 Base) MCG/ACT inhaler INHALE 1 TO 2 PUFFS EVERY 4 TO 6 HOURS AS NEEDED. 05/02/16   [provider]  budesonide-formoterol (SYMBICORT) 160-4.5 MCG/ACT inhaler INHALE 1 PUFF TWICE DAILY 05/02/16   [provider]  doxazosin (CARDURA) 4 MG  tablet Take 4 mg by mouth daily.    [provider]  doxazosin (CARDURA) 8 MG tablet Take 1 tablet by mouth at bedtime. 10/25/20   [provider]  doxazosin (CARDURA) 8 MG tablet Take by mouth. 01/27/21   [provider]      Allergies    Patient has no known allergies.    Review of Systems   Review of Systems  Musculoskeletal:  Positive for back pain.    Physical Exam Updated Vital Signs BP (!) 158/69   Pulse (!) 103   Temp 98.1 F (36.7 C) (Oral)   Resp 19   Ht 5\' 8"  (1.727 m)   Wt 52.2 kg   SpO2 95%   BMI 17.49 kg/m  Physical Exam Vitals and nursing note reviewed.  Constitutional:      General: He is not in acute distress.    Appearance: He is well-developed.  HENT:     Head: Normocephalic and atraumatic.  Eyes:     Conjunctiva/sclera: Conjunctivae normal.     Pupils: Pupils are equal, round, and reactive to light.  Cardiovascular:     Rate and Rhythm: Normal rate and regular rhythm.     Heart sounds: No murmur heard. Pulmonary:     Effort: Pulmonary effort is normal. No respiratory distress.     Breath sounds: Normal breath sounds. No wheezing  or rales.  Abdominal:     General: There is no distension.     Palpations: Abdomen is soft.     Tenderness: There is no abdominal tenderness. There is no right CVA tenderness, left CVA tenderness, guarding or rebound.  Musculoskeletal:        General: No tenderness. Normal range of motion.     Cervical back: Normal range of motion and neck supple.     Right lower leg: No edema.     Left lower leg: No edema.     Comments: Normal flexion extension at the back  Skin:    General: Skin is warm and dry.     Findings: No erythema or rash.  Neurological:     Mental Status: He is alert and oriented to person, place, and time.     Comments: Slurred speech and right leg weakness.  Left leg strength is 5 out of 5  Psychiatric:        Behavior: Behavior normal.     ED Results / Procedures /  Treatments   Labs (all labs ordered are listed, but only abnormal results are displayed) Labs Reviewed - No data to display  EKG None  Radiology No results found.  Procedures Procedures    Medications Ordered in ED Medications  methocarbamol (ROBAXIN) tablet 500 mg (500 mg Oral Given 04/19/23 1040)  ketorolac (TORADOL) 30 MG/ML injection 30 mg (30 mg Intramuscular Given 04/19/23 1040)    ED Course/ Medical Decision Making/ A&P                                 Medical Decision Making Risk Prescription drug management.   Pt with gradual onset of back pain suggestive of recurrent chronic pain.  No neurovascular compromise and no incontinence.  Pt has no infectious sx, hx of CA  or other red flags concerning for pathologic back pain.  Pt is able to ambulate but is painful.  Normal strength and reflexes on exam on the left leg.  Weakness on the right leg which is baseline.  Denies trauma. Pt has been seen in the past for similar sx and went through PT.  No findings concerning for cauda equina. Will give pt pain control and to return for developement of above sx.  12:23 PM After meds pt states he feels better.  Will attempt to ambulate. And he did well with his rollator.  Will d/c home with robaxin prn. And pcp f/u.       Final Clinical Impression(s) / ED Diagnoses Final diagnoses:  Lumbar back pain  Left hip pain  Muscle spasm    Rx / DC Orders ED Discharge Orders          Ordered    methocarbamol (ROBAXIN) 500 MG tablet  2 times daily        04/19/23 1222              Gwyneth Sprout, MD 04/19/23 1223

## 2023-04-19 NOTE — Discharge Instructions (Signed)
You can continue to take Tylenol and ibuprofen as needed and you are given a prescription for muscle relaxer that you only need to take if you feel that you are having spasms.  Follow-up with your doctor if you are still having issues.  Return to the emergency room if you start having new weakness in your leg, difficulty controlling your bowel or bladder or any fever.

## 2024-04-21 ENCOUNTER — Encounter: Payer: Self-pay | Admitting: Podiatry

## 2024-04-21 ENCOUNTER — Ambulatory Visit: Payer: Medicare (Managed Care) | Admitting: Podiatry

## 2024-04-21 DIAGNOSIS — M79676 Pain in unspecified toe(s): Secondary | ICD-10-CM

## 2024-04-21 DIAGNOSIS — B351 Tinea unguium: Secondary | ICD-10-CM

## 2024-04-29 NOTE — Progress Notes (Signed)
  Subjective:  Patient ID: Austin Perez, male    DOB: [birth year],  MRN: [id number]  Austin Perez presents to clinic today for painful mycotic toenails x 10 which interfere with daily activities. Pain is relieved with periodic professional debridement. He is accompanied by his brother on today's visit. Chief Complaint  Patient presents with   Toe Pain    Denies being diabetic. Dr. Leila retired he now see Dr. Lynwood   New problem(s): None.   PCP is Leila Lucie LABOR, MD.  No Known Allergies  Review of Systems: Negative except as noted in the HPI.  Objective: No changes noted in today's physical examination. There were no vitals filed for this visit. Austin Perez is a pleasant 83 y.o. male in NAD. AAO x 3.  Vascular Examination: Capillary refill time <3 seconds b/l LE. Palpable pedal pulses b/l LE. Digital hair present b/l. No pedal edema b/l. Skin temperature gradient WNL b/l. No varicosities b/l. No cyanosis or clubbing noted b/l LE.SABRA  Dermatological Examination: Pedal skin with normal turgor, texture and tone b/l. No open wounds. No interdigital macerations b/l. Toenails 1-5 b/l thickened, discolored, dystrophic with subungual debris. There is pain on palpation to dorsal aspect of nailplates. No hyperkeratotic nor porokeratotic lesions present on today's visit.SABRA  Neurological Examination: Protective sensation intact with 10 gram monofilament b/l LE. Vibratory sensation intact b/l LE.   Musculoskeletal Examination: Normal muscle strength 5/5 to all lower extremity muscle groups bilaterally. Hammertoe deformity noted 2-5 b/l. No pain, crepitus or joint limitation noted with ROM b/l LE.  Patient ambulates with rollator assistance.  Assessment/Plan: 1. Pain due to onychomycosis of toenail   Consent given for treatment. Patient examined. All patient's and/or POA's questions/concerns addressed on today's visit. Mycotic toenails 1-5 b/l debrided in length and girth without  incident. Continue soft, supportive shoe gear daily. Report any pedal injuries to medical professional. Call office if there are any quesitons/concerns. -Patient/POA to call should there be question/concern in the interim.   Return in about 3 months (around 07/22/2024).  Delon LITTIE Merlin, DPM      [place] LOCATION: [address] [place] [address]                   Office [phone]   [facility] LOCATION: [address] [place], [place] [address] Office [phone]

## 2024-08-05 ENCOUNTER — Ambulatory Visit: Payer: Medicare (Managed Care) | Admitting: Podiatry
